# Patient Record
Sex: Female | Born: 1960 | Race: White | Hispanic: No | Marital: Single | Smoking: Never smoker
Health system: Southern US, Community
[De-identification: ages and names within clinical notes are randomized; demographics above are authoritative.]

## PROBLEM LIST (undated history)

## (undated) HISTORY — PX: WRIST SURGERY: SHX841

## (undated) HISTORY — PX: CHOLECYSTECTOMY: SHX55

## (undated) HISTORY — PX: APPENDECTOMY: SHX54

## (undated) HISTORY — PX: TONSILLECTOMY: SUR1361

---

## 2014-07-17 ENCOUNTER — Encounter (HOSPITAL_BASED_OUTPATIENT_CLINIC_OR_DEPARTMENT_OTHER): Payer: Self-pay | Admitting: *Deleted

## 2014-07-17 ENCOUNTER — Emergency Department (HOSPITAL_BASED_OUTPATIENT_CLINIC_OR_DEPARTMENT_OTHER)
Admission: EM | Admit: 2014-07-17 | Discharge: 2014-07-17 | Disposition: A | Discharge status: Home / Self Care | Payer: Medicaid - Out of State | Attending: Emergency Medicine | Admitting: Emergency Medicine

## 2014-07-17 DIAGNOSIS — S0501XA Injury of conjunctiva and corneal abrasion without foreign body, right eye, initial encounter: Secondary | ICD-10-CM

## 2014-07-17 DIAGNOSIS — Z91040 Latex allergy status: Secondary | ICD-10-CM | POA: Insufficient documentation

## 2014-07-17 DIAGNOSIS — S0591XA Unspecified injury of right eye and orbit, initial encounter: Secondary | ICD-10-CM | POA: Diagnosis present

## 2014-07-17 DIAGNOSIS — Y9389 Activity, other specified: Secondary | ICD-10-CM | POA: Insufficient documentation

## 2014-07-17 DIAGNOSIS — W504XXA Accidental scratch by another person, initial encounter: Secondary | ICD-10-CM | POA: Insufficient documentation

## 2014-07-17 DIAGNOSIS — Y9289 Other specified places as the place of occurrence of the external cause: Secondary | ICD-10-CM | POA: Diagnosis not present

## 2014-07-17 DIAGNOSIS — Y998 Other external cause status: Secondary | ICD-10-CM | POA: Insufficient documentation

## 2014-07-17 MED ORDER — TRAMADOL HCL 50 MG PO TABS
50.0000 mg | ORAL_TABLET | Freq: Four times a day (QID) | ORAL | Status: DC | PRN
Start: 2014-07-17 — End: 2021-10-29

## 2014-07-17 MED ORDER — ERYTHROMYCIN 5 MG/GM OP OINT: TOPICAL_OINTMENT | OPHTHALMIC | Status: AC

## 2014-07-17 MED ORDER — TETRACAINE HCL 0.5 % OP SOLN
2.0000 [drp] | Freq: Once | OPHTHALMIC | Status: AC
  Administered 2014-07-17: 2 [drp] via OPHTHALMIC
  Filled 2014-07-17: qty 2

## 2014-07-17 MED ORDER — FLUORESCEIN SODIUM 1 MG OP STRP
1.0000 | ORAL_STRIP | Freq: Once | OPHTHALMIC | Status: AC
  Administered 2014-07-17: 1 via OPHTHALMIC
  Filled 2014-07-17: qty 1

## 2014-07-17 NOTE — ED Provider Notes (Signed)
CSN: [id number]     Arrival date & time 07/17/14  1403 History   First MD Initiated Contact with Patient 07/17/14 1417     Chief Complaint  Patient presents with  . Eye Injury     (Consider location/radiation/quality/duration/timing/severity/associated sxs/prior Treatment) HPI Comments: Patient presents today with pain of the right eye.  She states that around 11 AM today an 54 month old child ran her finger across her right eye and scratched the eyeball.  She has had associated photophobia and tearing of the right eye since that time.  No vision changes.  No drainage aside from the tearing.  She has not taken anything for symptoms.  She does not wear contact lenses.    Patient is a 54 y.o. female presenting with eye injury. The history is provided by the patient.  Eye Injury    History reviewed. No pertinent past medical history. Past Surgical History  Procedure Laterality Date  . Wrist surgery    . Appendectomy    . Cholecystectomy    . Tonsillectomy     No family history on file. History  Substance Use Topics  . Smoking status: Never Smoker   . Smokeless tobacco: Not on file  . Alcohol Use: No   OB History    No data available     Review of Systems  All other systems reviewed and are negative.     Allergies  Codeine; Iodine; and Latex  Home Medications   Prior to Admission medications   Not on File   BP 130/71 mmHg  Pulse 74  Temp(Src) 98.1 F (36.7 C) (Oral)  Resp 18  Ht 5\' 1"  (1.549 m)  Wt 180 lb (81.647 kg)  BMI 34.03 kg/m2  SpO2 98% Physical Exam  Constitutional: She appears well-developed and well-nourished.  HENT:  Head: Normocephalic and atraumatic.  Mouth/Throat: Oropharynx is clear and moist.  Eyes: EOM and lids are normal. Pupils are equal, round, and reactive to light. Lids are everted and swept, no foreign bodies found. Right conjunctiva is injected. Right conjunctiva has no hemorrhage.  Slit lamp exam:      The right eye shows corneal  abrasion and fluorescein uptake.    Visual Acuity  Right Eye Distance: 20/20 (with glasses) Left Eye Distance: 20/25 (with glasses) Bilateral Distance: 20/20 (with Glasses)       Cardiovascular: Normal rate, regular rhythm and normal heart sounds.   Pulmonary/Chest: Effort normal and breath sounds normal.  Neurological: She is alert.  Skin: Skin is warm and dry.  Psychiatric: She has a normal mood and affect.  Nursing note and vitals reviewed.   ED Course  Procedures (including critical care time) Labs Review Labs Reviewed - No data to display  Imaging Review No results found.   EKG Interpretation None      MDM   Final diagnoses:  None   Patient presenting with right eye pain after an 54 month old child scratches the eye with her finger.  Normal visual acuity.  Exam showing a corneal abrasion of the right eye.  Patient given Rx for Erythromycin ointment and referral to Ophthalmology.  She is stable for discharge.  Return precautions given.      Santiago GladHeather Gerber Penza, PA-C 07/17/14 2324  Geoffery Lyonsouglas Delo, MD 07/18/14 [phone]

## 2014-07-17 NOTE — ED Notes (Signed)
Scratch to her right eye by a child accidentally poking her in the eye. Eye patch on arrival. She wears glasses.

## 2017-06-28 ENCOUNTER — Encounter (HOSPITAL_BASED_OUTPATIENT_CLINIC_OR_DEPARTMENT_OTHER): Payer: Self-pay

## 2017-06-28 ENCOUNTER — Other Ambulatory Visit: Payer: Self-pay | Admitting: Family Medicine

## 2017-06-28 DIAGNOSIS — G4733 Obstructive sleep apnea (adult) (pediatric): Secondary | ICD-10-CM

## 2017-06-28 DIAGNOSIS — N950 Postmenopausal bleeding: Secondary | ICD-10-CM

## 2017-07-01 ENCOUNTER — Ambulatory Visit
Admission: RE | Admit: 2017-07-01 | Discharge: 2017-07-01 | Disposition: A | Discharge status: Home / Self Care | Payer: Medicaid - Out of State | Source: Ambulatory Visit | Attending: Family Medicine | Admitting: Family Medicine

## 2017-07-01 DIAGNOSIS — N950 Postmenopausal bleeding: Secondary | ICD-10-CM

## 2017-07-21 ENCOUNTER — Ambulatory Visit (HOSPITAL_BASED_OUTPATIENT_CLINIC_OR_DEPARTMENT_OTHER): Payer: BLUE CROSS/BLUE SHIELD | Attending: Family Medicine | Admitting: Internal Medicine

## 2017-07-21 DIAGNOSIS — G4733 Obstructive sleep apnea (adult) (pediatric): Secondary | ICD-10-CM | POA: Diagnosis not present

## 2017-07-21 DIAGNOSIS — R0902 Hypoxemia: Secondary | ICD-10-CM | POA: Insufficient documentation

## 2017-07-24 NOTE — Procedures (Signed)
   Patient Name: Kari Pineda, Kari Pineda Study Date: 07/21/2017 Gender: Female D.O.B: Nov 13, 1960 Age (years): 56 Referring Provider: Nadyne CoombesKaren Richter Height (inches): 62 Interpreting Physician: Jetty Duhamellinton Young MD, ABSM Weight (lbs): 195 RPSGT: Blossom SinkBarksdale, Vernon BMI: 36 MRN: [id number] Neck Size: 14.50 <br> <br> CLINICAL INFORMATION Sleep Study Type: HST Indication for sleep study: OSA  Epworth Sleepiness Score: 5  SLEEP STUDY TECHNIQUE A multi-channel overnight portable sleep study was performed. The channels recorded were: nasal airflow, thoracic respiratory movement, and oxygen saturation with a pulse oximetry. Snoring was also monitored.  MEDICATIONS Patient self administered medications include: none reported.  SLEEP ARCHITECTURE Patient was studied for 390.5 minutes. The sleep efficiency was 100.0 % and the patient was supine for 86.1%. The arousal index was 0.0 per hour.  RESPIRATORY PARAMETERS The overall AHI was 31.7 per hour, with a central apnea index of 0.0 per hour. The oxygen nadir was 76% during sleep.  CARDIAC DATA Mean heart rate during sleep was 72.8 bpm.  IMPRESSIONS - Severe obstructive sleep apnea occurred during this study (AHI = 31.7/h). - No significant central sleep apnea occurred during this study (CAI = 0.0/h). - Severe oxygen desaturation was noted during this study (Min O2 = 76%, Mean 94%). Time <= 89% = 13 minutes. - Patient snored.  DIAGNOSIS - Obstructive Sleep Apnea (327.23 [G47.33 ICD-10]) - Nocturnal Hypoxemia (327.26 [G47.36 ICD-10])  RECOMMENDATIONS - Recommend CPAP titration or AutoPAP. Otheer options would be based on clinical judgment. - Be careful with alcohol, sedatives and other CNS depressants that may worsen sleep apnea and disrupt normal sleep architecture. - Sleep hygiene should be reviewed to assess factors that may improve sleep quality. - Weight management and regular exercise should be initiated or continued.  [Electronically  signed] 07/31/2017 09:56 AM  Jetty Duhamellinton Young MD, ABSM Diplomate, American Board of Sleep Medicine   NPI: [id number]                          Jetty Duhamellinton Young Diplomate, American Board of Sleep Medicine  ELECTRONICALLY SIGNED ON:  07/24/2017, 10:15 AM [facility] PH: [phone]   FX: [phone] ACCREDITED BY THE AMERICAN ACADEMY OF SLEEP MEDICINE

## 2017-07-31 DIAGNOSIS — G4733 Obstructive sleep apnea (adult) (pediatric): Secondary | ICD-10-CM | POA: Diagnosis not present

## 2019-01-19 IMAGING — US US PELVIS COMPLETE TRANSABD/TRANSVAG
1 series · 14 of 25 positions shown · non-contrast
Comparison: None

CLINICAL DATA: Postmenopausal bleeding

EXAM:
TRANSABDOMINAL AND TRANSVAGINAL ULTRASOUND OF PELVIS
TECHNIQUE: Both transabdominal and transvaginal ultrasound examinations of the
pelvis were performed. Transabdominal technique was performed for
global imaging of the pelvis including uterus, ovaries, adnexal
regions, and pelvic cul-de-sac. It was necessary to proceed with
endovaginal exam following the transabdominal exam to visualize the
ovaries and endometrium.

[Series 1: us pelvis complete transabd/transvag · 0.25mm/px · 14 of 42 slices shown]
[im 1/42]
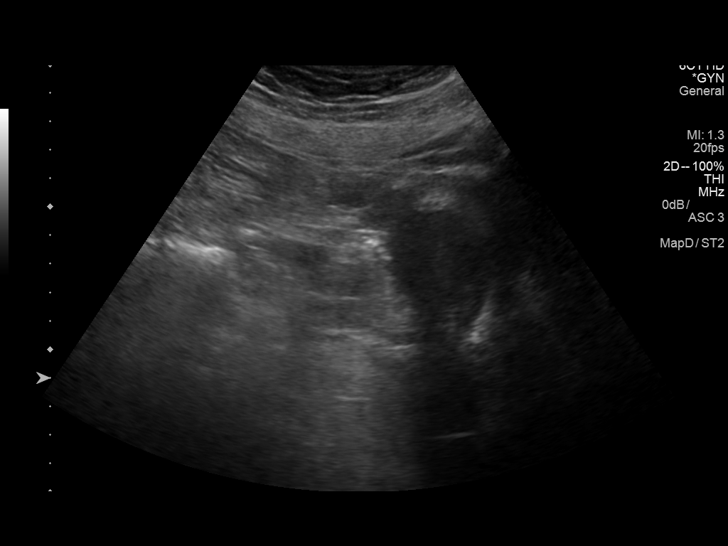
[im 4/42]
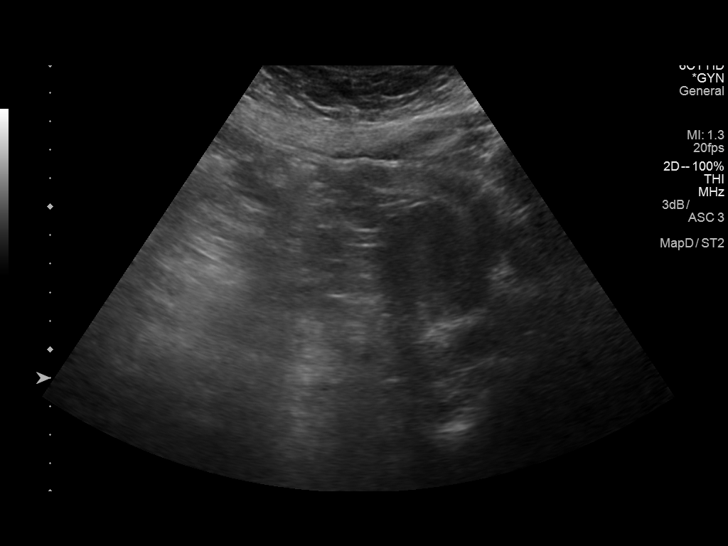
[im 7/42]
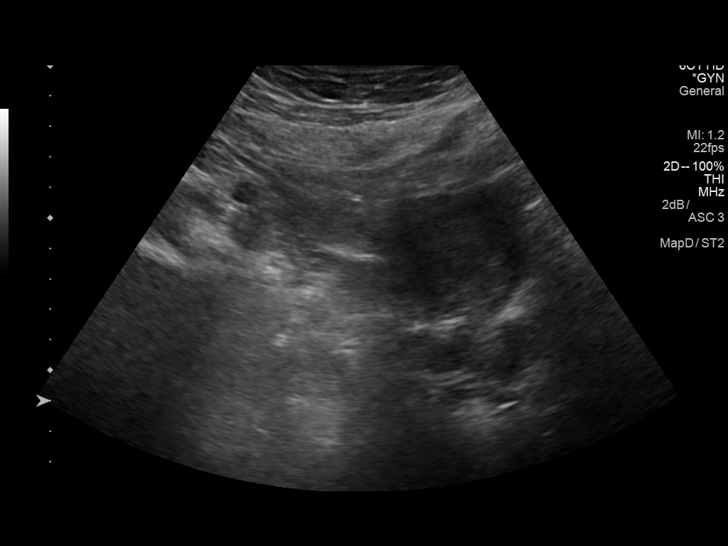
[im 11/42]
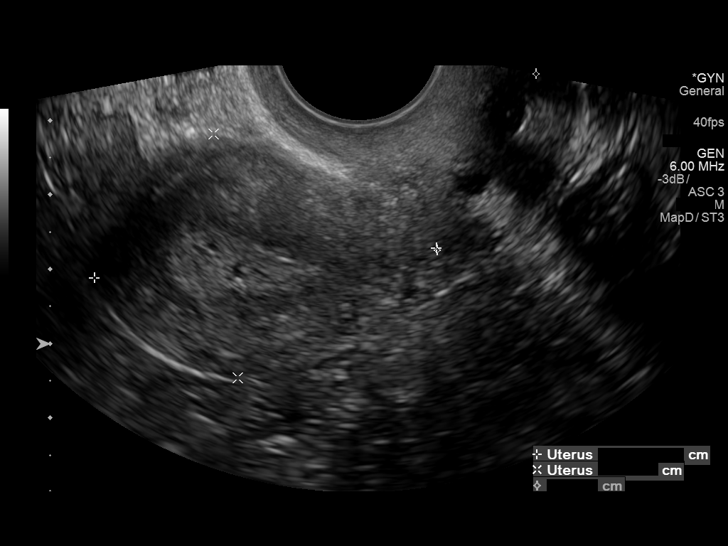
[im 14/42]
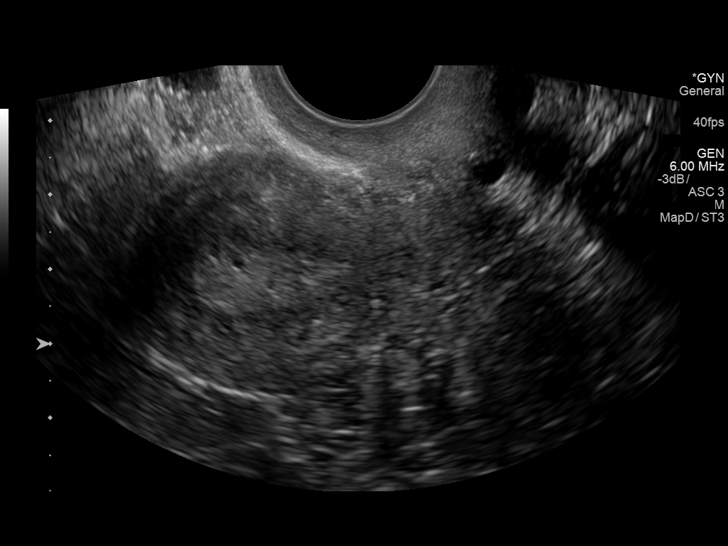
[im 16/42]
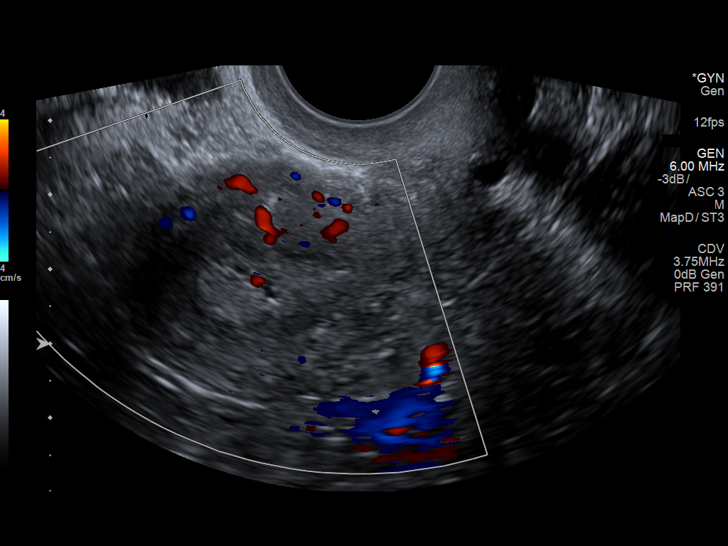
[im 19/42]
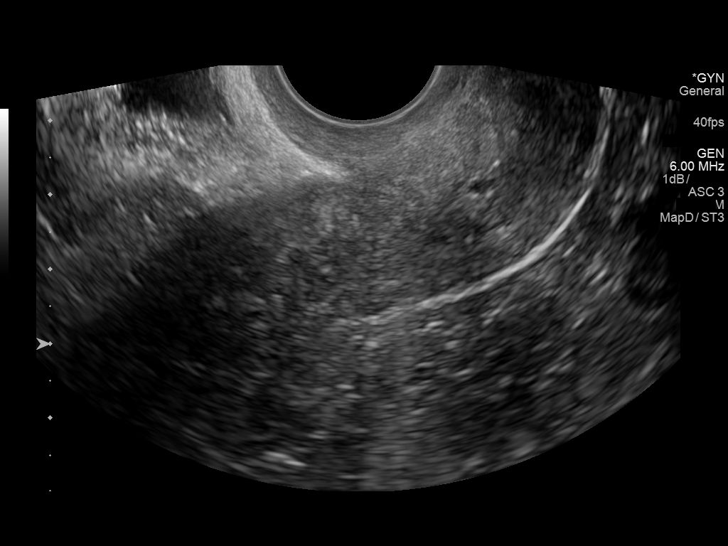
[im 23/42]
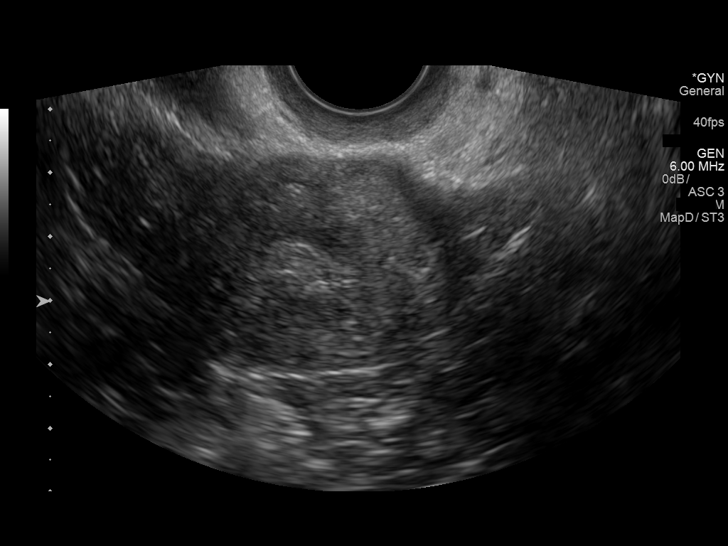
[im 26/42]
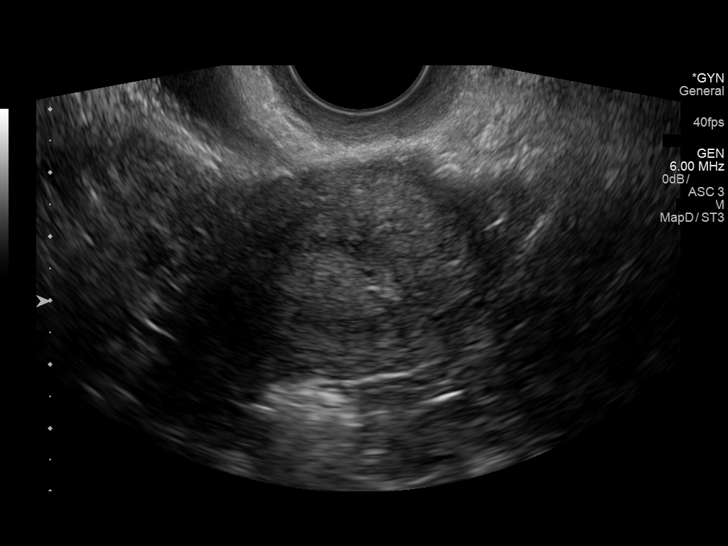
[im 28/42]
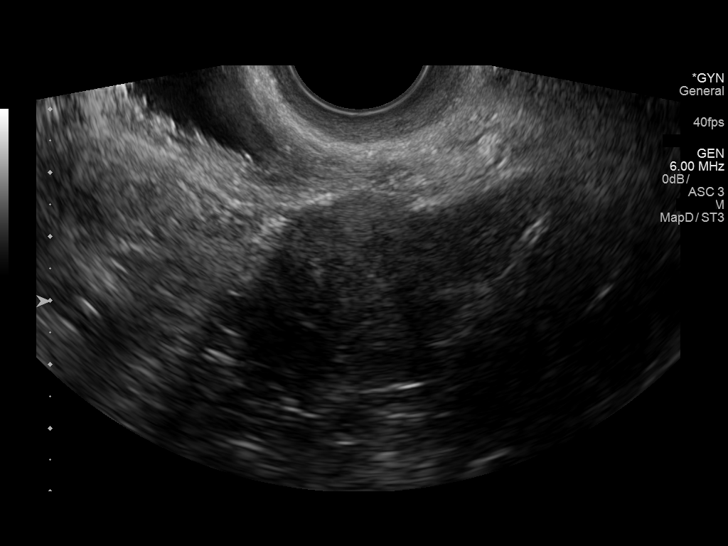
[im 31/42]
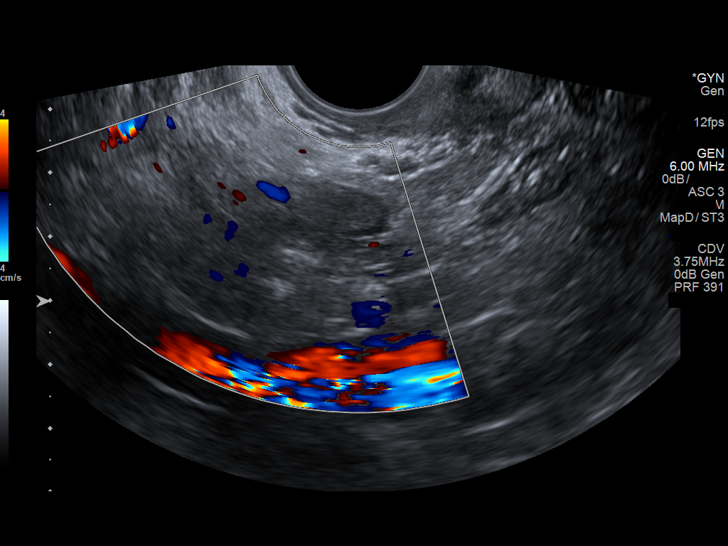
[im 35/42]
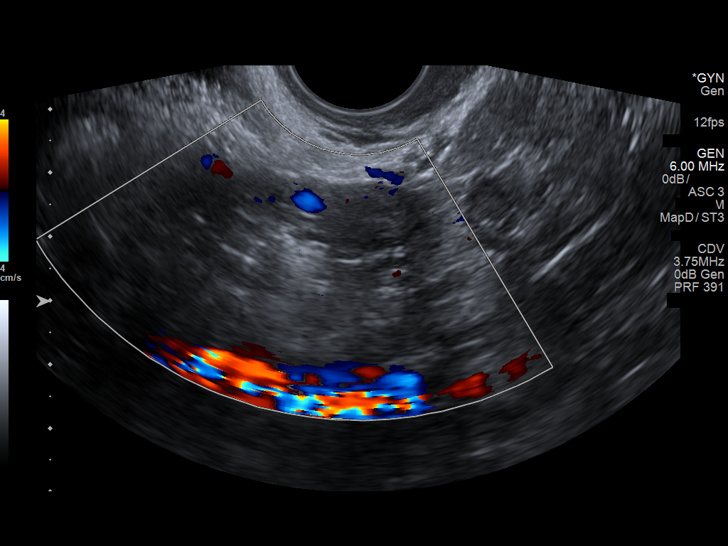
[im 38/42]
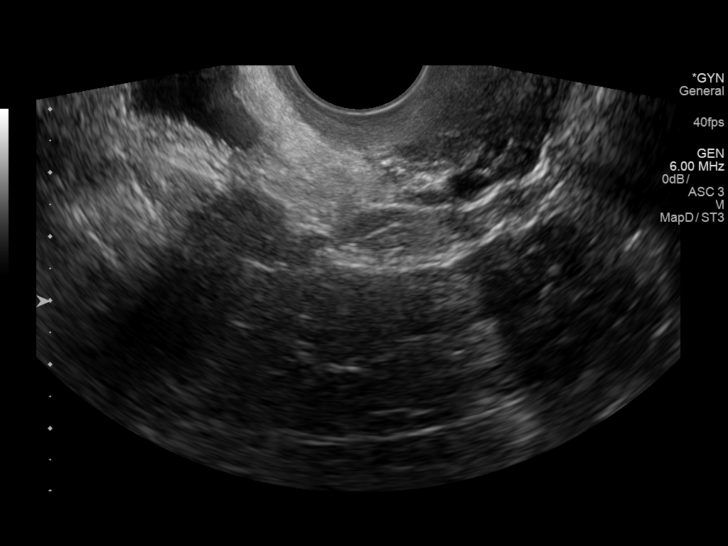
[im 42/42]
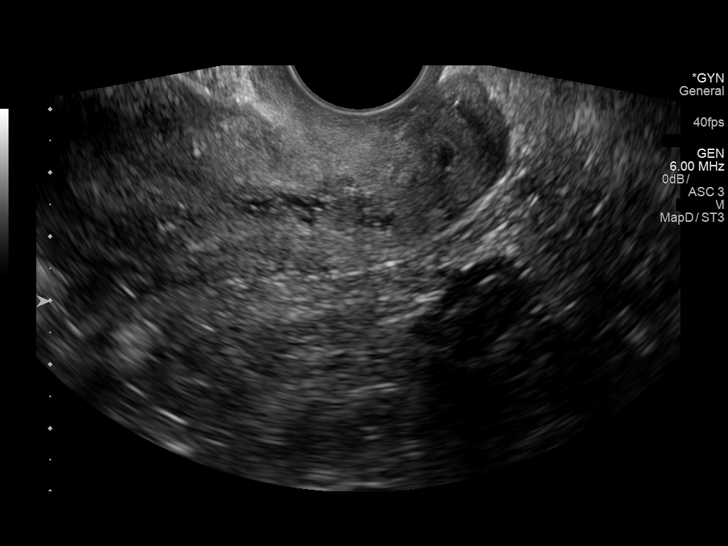

[14 of 25 positions shown; findings below may reference images not displayed]

FINDINGS: Uterus

Measurements: 7.8 x 3.7 x 4.8 cm. No myometrial abnormalities are
identified. Nabothian cysts are noted at the cervix.

Endometrium

Thickness: 10 mm.  A few small endometrial cysts are noted.

Right ovary

Measurements: 1.9 x 1.2 x 2.0 cm. Normal appearance/no adnexal mass.

Left ovary

Not visualized.  No adnexal mass.

Other findings

No abnormal free fluid.
IMPRESSION: 1. Thickened endometrium given postmenopausal status. Recommend
endometrial sampling.
2. No myometrial abnormalities.
3. Normal right ovary.  Left ovary not visualized.

## 2019-03-28 ENCOUNTER — Ambulatory Visit: Payer: BLUE CROSS/BLUE SHIELD | Admitting: Cardiovascular Disease

## 2019-04-03 ENCOUNTER — Ambulatory Visit: Payer: Self-pay | Admitting: Cardiovascular Disease

## 2019-04-03 ENCOUNTER — Other Ambulatory Visit: Payer: Self-pay

## 2021-10-29 ENCOUNTER — Ambulatory Visit
Admission: EM | Admit: 2021-10-29 | Discharge: 2021-10-29 | Disposition: A | Discharge status: Home / Self Care | Payer: 59 | Attending: Urgent Care | Admitting: Urgent Care

## 2021-10-29 ENCOUNTER — Encounter: Payer: Self-pay | Admitting: Emergency Medicine

## 2021-10-29 ENCOUNTER — Other Ambulatory Visit: Payer: Self-pay

## 2021-10-29 DIAGNOSIS — M7660 Achilles tendinitis, unspecified leg: Secondary | ICD-10-CM | POA: Diagnosis not present

## 2021-10-29 DIAGNOSIS — M25572 Pain in left ankle and joints of left foot: Secondary | ICD-10-CM

## 2021-10-29 DIAGNOSIS — W19XXXA Unspecified fall, initial encounter: Secondary | ICD-10-CM

## 2021-10-29 DIAGNOSIS — Z87828 Personal history of other (healed) physical injury and trauma: Secondary | ICD-10-CM

## 2021-10-29 DIAGNOSIS — M79662 Pain in left lower leg: Secondary | ICD-10-CM | POA: Diagnosis not present

## 2021-10-29 MED ORDER — IBUPROFEN 800 MG PO TABS: 800.0000 mg | ORAL_TABLET | Freq: Three times a day (TID) | ORAL | 0 refills | Status: AC

## 2021-10-29 MED ORDER — TRAMADOL HCL 50 MG PO TABS: 50.0000 mg | ORAL_TABLET | Freq: Four times a day (QID) | ORAL | 0 refills | Status: AC | PRN

## 2021-10-29 NOTE — ED Triage Notes (Signed)
Patient presents to [facility] for evaluation of left calf and foot and ankle pain after stepping off a ladder yesterday and feeling a pop.  Patient states she has a hx of achilles tendon tear to this ankle, feels similar ?

## 2021-10-29 NOTE — Discharge Instructions (Addendum)
He had declined an x-ray with [place] today.  However it will be very important to make sure you see an orthopedist from the list I provide you.  This is important to make sure that you do not end up having a fracture or recurrent Achilles tendon rupture.  They can also evaluate for a tear of your calf muscle.  Try Dr. Jena Gauss first as they are on call.  Make sure you stay off of your foot.  Use your crutches if you absolutely have to move around.  Start ibuprofen for pain and inflammation.  Use tramadol for severe pain not controlled by ibuprofen.  However, if you do not need tramadol then do not use it. ?

## 2021-10-29 NOTE — ED Provider Notes (Signed)
?[place] ? ? ?MRN: [id number] DOB: 26-Jan-1961 ? ?Subjective:  ? ?Kari Pineda is a 61 y.o. female presenting for 1 day history of suffering a left lower leg, ankle injury.  Patient was coming off of a ladder and lost her footing causing her foot to hyper flex/hyper dorsiflex.  She has a history of a torn Achilles tendon and is worried that this is happened again.  She does recall that she heard 2 distinct pops.  One of them occurred when she was coming off the ladder and another when she was being stabilized and being helped to her feet.  She has since had significant difficulty bearing any kind of weight on her left foot.  Has had severe pain.  Has been icing. ? ?No current facility-administered medications for this encounter. ? ?Current Outpatient Medications:  ?  erythromycin ophthalmic ointment, Place a 1/2 inch ribbon of ointment into the lower eyelid.  Use four times daily for 5 days, Disp: 1 g, Rfl: 0 ?  traMADol (ULTRAM) 50 MG tablet, Take 1 tablet (50 mg total) by mouth every 6 (six) hours as needed., Disp: 15 tablet, Rfl: 0  ? ?Allergies  ?Allergen Reactions  ? Codeine Anaphylaxis  ? Iodine Anaphylaxis  ? Penicillins Anaphylaxis  ? Latex   ?  rash  ? ? ?History reviewed. No pertinent past medical history.  ? ?Past Surgical History:  ?Procedure Laterality Date  ? APPENDECTOMY    ? CHOLECYSTECTOMY    ? TONSILLECTOMY    ? WRIST SURGERY    ? ? ?Family History  ?Problem Relation Age of Onset  ? Diabetes Mother   ? Heart attack Father   ? ? ?Social History  ? ?Tobacco Use  ? Smoking status: Never  ?Substance Use Topics  ? Alcohol use: No  ? Drug use: No  ? ? ?ROS ? ? ?Objective:  ? ?Vitals: ?BP 126/87 (BP Location: Right Arm)   Pulse 72   Temp 98.3 ?F (36.8 ?C) (Oral)   Resp 18   SpO2 96%  ? ?Physical Exam ?Constitutional:   ?   General: She is not in acute distress. ?   Appearance: Normal appearance. She is well-developed. She is not ill-appearing, toxic-appearing or  diaphoretic.  ?HENT:  ?   Head: Normocephalic and atraumatic.  ?   Nose: Nose normal.  ?   Mouth/Throat:  ?   Mouth: Mucous membranes are moist.  ?Eyes:  ?   General: No scleral icterus.    ?   Right eye: No discharge.     ?   Left eye: No discharge.  ?   Extraocular Movements: Extraocular movements intact.  ?Cardiovascular:  ?   Rate and Rhythm: Normal rate.  ?Pulmonary:  ?   Effort: Pulmonary effort is normal.  ?Musculoskeletal:  ?   Left lower leg: Tenderness (over mid to superior gastrocnemius) present. No swelling, deformity, lacerations or bony tenderness. No edema.  ?   Left ankle: Swelling present. No deformity, ecchymosis or lacerations. Tenderness present over the lateral malleolus and AITF ligament. No medial malleolus, ATF ligament, CF ligament, posterior TF ligament, base of 5th metatarsal or proximal fibula tenderness. Decreased range of motion.  ?   Left Achilles Tendon: Tenderness present.  ?   Comments: Patient guarding extensively and therefore cannot able to evaluate for Thompson's test.  ?Skin: ?   General: Skin is warm and dry.  ?Neurological:  ?   General: No focal deficit present.  ?  Mental Status: She is alert and oriented to person, place, and time.  ?Psychiatric:     ?   Mood and Affect: Mood normal.     ?   Behavior: Behavior normal.  ? ? ?Patient was placed into a short leg splint with the left leg in slight plantarflexion.  Provided with crutches to ambulate. ? ?Assessment and Plan :  ? ?I have reviewed the PDMP during this encounter. ? ?1. Acute left ankle pain   ?2. Pain of left calf   ?3. Pain in Achilles tendon   ?4. History of rupture of Achilles tendon   ?5. Accidental fall, initial encounter   ? ?Patient declined an offer for an outpatient x-ray x3.  She is primarily concerned with being evaluated for an Achilles tendon rupture.  I advised that this can be done best through an MRI or ultrasound but was willing to do the x-ray as there are findings that can suggest an Achilles  tendon injury.  Still patient refused.  We will try conservative management with immobilization, crutches for ambulation.  Emphasized need to follow-up urgently with an orthopedist and provided her with information for this.  For pain, use ibuprofen regularly 800 mg 3 times a day with food.  Use tramadol for breakthrough pain.  She does have a history of anaphylaxis with codeine but has done well with tramadol in the past. Counseled patient on potential for adverse effects with medications prescribed/recommended today, ER and return-to-clinic precautions discussed, patient verbalized understanding. ? ?  ?Jaynee Eagles, PA-C ?10/29/21 1128 ? ?

## 2022-04-04 ENCOUNTER — Other Ambulatory Visit: Payer: Self-pay

## 2022-04-04 ENCOUNTER — Encounter (HOSPITAL_BASED_OUTPATIENT_CLINIC_OR_DEPARTMENT_OTHER): Payer: Self-pay | Admitting: Emergency Medicine

## 2022-04-04 ENCOUNTER — Emergency Department (HOSPITAL_BASED_OUTPATIENT_CLINIC_OR_DEPARTMENT_OTHER)
Admission: EM | Admit: 2022-04-04 | Discharge: 2022-04-04 | Disposition: A | Discharge status: Home / Self Care | Payer: Commercial Managed Care - HMO | Attending: Emergency Medicine | Admitting: Emergency Medicine

## 2022-04-04 DIAGNOSIS — Z91040 Latex allergy status: Secondary | ICD-10-CM | POA: Diagnosis not present

## 2022-04-04 DIAGNOSIS — K0889 Other specified disorders of teeth and supporting structures: Secondary | ICD-10-CM | POA: Diagnosis present

## 2022-04-04 DIAGNOSIS — K047 Periapical abscess without sinus: Secondary | ICD-10-CM | POA: Diagnosis not present

## 2022-04-04 MED ORDER — CHLORHEXIDINE GLUCONATE 0.12 % MT SOLN: 15.0000 mL | Freq: Two times a day (BID) | OROMUCOSAL | 0 refills | Status: AC

## 2022-04-04 MED ORDER — CLINDAMYCIN HCL 150 MG PO CAPS
300.0000 mg | ORAL_CAPSULE | Freq: Once | ORAL | Status: AC
  Administered 2022-04-04: 300 mg via ORAL
  Filled 2022-04-04: qty 2

## 2022-04-04 MED ORDER — CLINDAMYCIN HCL 150 MG PO CAPS: 450.0000 mg | ORAL_CAPSULE | Freq: Four times a day (QID) | ORAL | 0 refills | Status: AC

## 2022-04-04 MED ORDER — BUPIVACAINE HCL (PF) 0.5 % IJ SOLN: 10.0000 mL | Freq: Once | INTRAMUSCULAR | Status: DC

## 2022-04-04 MED ORDER — BUPIVACAINE-EPINEPHRINE (PF) 0.5% -1:200000 IJ SOLN
1.8000 mL | Freq: Once | INTRAMUSCULAR | Status: AC
  Administered 2022-04-04: 1.8 mL
  Filled 2022-04-04: qty 1.8

## 2022-04-04 MED ORDER — BENZOCAINE 20 % MT AERO
INHALATION_SPRAY | Freq: Once | OROMUCOSAL | Status: AC
  Administered 2022-04-04: 1 via OROMUCOSAL
  Filled 2022-04-04: qty 57

## 2022-04-04 NOTE — ED Notes (Signed)
Pt ambulatory to exam room at this time escorted by charge nurse - no acute distress noted.

## 2022-04-04 NOTE — ED Notes (Signed)
ED Provider at bedside to drain dental abscess

## 2022-04-04 NOTE — ED Triage Notes (Addendum)
Pt states she woke up tonight with facial pain and swelling on the left side  Pt states she thinks it may be from a tooth  Pt is c/o painPt states she took tylenol and ibuprofen at home around 0130 without relief

## 2022-04-04 NOTE — ED Notes (Signed)
Pt agreeable with d/c plan as discussed by provider- this nurse has verbally reinforced d/c instructions and provided pt with written copy; pt acknowledges verbal understanding and denies any addl questions concerns needs- ambulatory independently at d/c with steady gait; no acute changes or distress noted.

## 2022-04-04 NOTE — ED Provider Notes (Signed)
[facility] EMERGENCY DEPARTMENT Provider Note   CSN: [id number] Arrival date & time: 04/04/22  8115     History  Chief Complaint  Patient presents with   Dental Pain    Kari Pineda is a 62 y.o. female.  The history is provided by the patient and medical records.  Dental Pain Kari Pineda is a 61 y.o. female who presents to the Emergency Department complaining of dental pain.  She was in her routine state of when she went to bed and woke up around 1 AM with left lower jaw pain.  No fever but does feel swollen in that area.  She took 2 Tylenol and 4 ibuprofen at home with persistent pain.  No associated throat swelling or difficulty breathing.  No known medical problems and takes no medications.  She does have a dentist.      Home Medications Prior to Admission medications   Medication Sig Start Date End Date Taking? Authorizing Provider  chlorhexidine (PERIDEX) 0.12 % solution Use as directed 15 mLs in the mouth or throat 2 (two) times daily. 04/04/22  Yes Tilden Fossa, MD  clindamycin (CLEOCIN) 150 MG capsule Take 3 capsules (450 mg total) by mouth every 6 (six) hours. 04/04/22  Yes Tilden Fossa, MD  erythromycin ophthalmic ointment Place a 1/2 inch ribbon of ointment into the lower eyelid.  Use four times daily for 5 days 07/17/14   Santiago Glad, PA-C  ibuprofen (ADVIL) 800 MG tablet Take 1 tablet (800 mg total) by mouth 3 (three) times daily. 10/29/21   Wallis Bamberg, PA-C  traMADol (ULTRAM) 50 MG tablet Take 1 tablet (50 mg total) by mouth every 6 (six) hours as needed. 10/29/21   Wallis Bamberg, PA-C      Allergies    Codeine, Iodine, Penicillins, and Latex    Review of Systems   Review of Systems  All other systems reviewed and are negative.   Physical Exam Updated Vital Signs BP (!) 147/88   Pulse 65   Temp 98.5 F (36.9 C) (Oral)   Resp 16   Ht 5\' 2"  (1.575 m)   Wt 86.2 kg   SpO2 97%   BMI 34.75 kg/m  Physical Exam Vitals and nursing  note reviewed.  Constitutional:      Appearance: She is well-developed.  HENT:     Head: Normocephalic and atraumatic.     Comments: There is a cap on tooth 20 with significant tenderness to palpation in this area.  There is mild to moderate edema of the buccal mucosa on the left with significant tenderness to palpation at the base of tooth 20 with focal fullness there.  There is no significant sublingual swelling.  There is no swelling in the posterior oropharynx Cardiovascular:     Rate and Rhythm: Normal rate and regular rhythm.  Pulmonary:     Effort: Pulmonary effort is normal. No respiratory distress.  Musculoskeletal:        General: No tenderness.  Skin:    General: Skin is warm and dry.  Neurological:     Mental Status: She is alert and oriented to person, place, and time.  Psychiatric:        Behavior: Behavior normal.     ED Results / Procedures / Treatments   Labs (all labs ordered are listed, but only abnormal results are displayed) Labs Reviewed - No data to display  EKG None  Radiology No results found.  Procedures Dental Block  Date/Time: 04/04/2022 6:43 AM  Performed by: Quintella Reichert, MD Authorized by: Quintella Reichert, MD   Consent:    Consent obtained:  Verbal   Consent given by:  Patient   Risks discussed:  Infection, hematoma, pain, unsuccessful block and swelling Universal protocol:    Patient identity confirmed:  Verbally with patient Indications:    Indications: dental abscess   Location:    Block type:  Supraperiosteal   Supraperiosteal location:  Lower teeth   Lower teeth location:  20/LL 2nd bicuspid Procedure details:    Topical anesthetic:  Benzocaine spray   Syringe type:  Aspirating dental syringe   Needle gauge:  27 G   Anesthetic injected:  Bupivacaine 0.5% WITH epi   Injection procedure:  Anatomic landmarks identified, introduced needle and incremental injection Post-procedure details:    Outcome:  Pain relieved    Procedure completion:  Tolerated .Marland KitchenIncision and Drainage  Date/Time: 04/04/2022 6:44 AM  Performed by: Quintella Reichert, MD Authorized by: Quintella Reichert, MD   Consent:    Consent obtained:  Verbal   Consent given by:  Patient   Risks discussed:  Bleeding, incomplete drainage, infection and pain   Alternatives discussed:  Alternative treatment Universal protocol:    Patient identity confirmed:  Verbally with patient Location:    Type:  Abscess   Location:  Mouth   Mouth location: subperiostial, tooth 20. Sedation:    Sedation type:  None Anesthesia:    Anesthesia method: dental block - separate note. Procedure type:    Complexity:  Simple Procedure details:    Incision types:  Stab incision   Drainage:  Bloody   Drainage amount:  Scant   Wound treatment:  Wound left open Post-procedure details:    Procedure completion:  Tolerated well, no immediate complications     Medications Ordered in ED Medications  clindamycin (CLEOCIN) capsule 300 mg (300 mg Oral Given 04/04/22 0548)  Benzocaine (HURRCAINE) 20 % mouth spray (1 Application Mouth/Throat Given 04/04/22 0549)  bupivacaine-epinephrine (PF) (MARCAINE W/ EPI) 0.5% -1:200000 injection 1.8 mL (1.8 mLs Infiltration Given 04/04/22 0550)    ED Course/ Medical Decision Making/ A&P                           Medical Decision Making Risk Prescription drug management.   Patient here for evaluation of dental pain and facial swelling.  She is nontoxic-appearing on evaluation.  She does have an examination is consistent with dental abscess with culprit tooth #20.  She received a dental block with good relief of pain.  I&D was performed with minimal purulent material removed predominantly bloody material.  Discussed with patient home care for dental abscess.  Discussed close dentistry follow-up as well as return precautions.  Also discussed OTC analgesics for pain.  Presentation is not consistent with sepsis, Ludwigs angina,  sialoadenitis       Final Clinical Impression(s) / ED Diagnoses Final diagnoses:  Dental abscess    Rx / DC Orders ED Discharge Orders          Ordered    clindamycin (CLEOCIN) 150 MG capsule  Every 6 hours        04/04/22 0607    chlorhexidine (PERIDEX) 0.12 % solution  2 times daily        04/04/22 1607              Quintella Reichert, MD 04/04/22 [phone]

## 2024-02-11 ENCOUNTER — Other Ambulatory Visit: Payer: Self-pay

## 2024-02-11 ENCOUNTER — Emergency Department (HOSPITAL_COMMUNITY)
Admission: EM | Admit: 2024-02-11 | Discharge: 2024-02-12 | Disposition: A | Discharge status: Home / Self Care | Attending: Emergency Medicine | Admitting: Emergency Medicine

## 2024-02-11 ENCOUNTER — Emergency Department (HOSPITAL_COMMUNITY)

## 2024-02-11 DIAGNOSIS — Y92410 Unspecified street and highway as the place of occurrence of the external cause: Secondary | ICD-10-CM | POA: Diagnosis not present

## 2024-02-11 DIAGNOSIS — M542 Cervicalgia: Secondary | ICD-10-CM | POA: Diagnosis not present

## 2024-02-11 DIAGNOSIS — M25572 Pain in left ankle and joints of left foot: Secondary | ICD-10-CM | POA: Insufficient documentation

## 2024-02-11 DIAGNOSIS — M545 Low back pain, unspecified: Secondary | ICD-10-CM | POA: Insufficient documentation

## 2024-02-11 DIAGNOSIS — Z91040 Latex allergy status: Secondary | ICD-10-CM | POA: Insufficient documentation
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
## 2024-02-12 ENCOUNTER — Encounter (HOSPITAL_COMMUNITY): Payer: Self-pay

## 2024-02-12 MED ORDER — METHOCARBAMOL 500 MG PO TABS: 500.0000 mg | ORAL_TABLET | Freq: Two times a day (BID) | ORAL | 0 refills | Status: AC

## 2024-02-12 MED ORDER — NAPROXEN 250 MG PO TABS
500.0000 mg | ORAL_TABLET | Freq: Once | ORAL | Status: AC
  Administered 2024-02-12: 500 mg via ORAL
  Filled 2024-02-12: qty 2

## 2024-02-12 MED ORDER — NAPROXEN 500 MG PO TABS: 500.0000 mg | ORAL_TABLET | Freq: Two times a day (BID) | ORAL | 0 refills | Status: AC

## 2024-02-12 MED ORDER — METHOCARBAMOL 500 MG PO TABS
500.0000 mg | ORAL_TABLET | Freq: Once | ORAL | Status: AC
  Administered 2024-02-12: 500 mg via ORAL
  Filled 2024-02-12: qty 1

## 2024-02-12 NOTE — ED Notes (Signed)
 PT D/C'D AFTER INSTRUCTIONS REVIEWED. PT VERBALIZED UNDERSTANDING. NAD REPORTED OR NOTED AT THIS TIME.
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
## 2024-02-12 NOTE — Discharge Instructions (Signed)
 Take the prescribed medication as directed.  Can try to ice and elevate ankle as well to keep any pain/swelling down.  May want to use heating pad on neck/back for spasm. Follow-up with Dr. Sharl if ankle continues to have issues over the next week or so.  Call his office for appt. Return to the ED for new or worsening symptoms.

## 2024-03-06 ENCOUNTER — Encounter: Payer: Self-pay | Admitting: Physician Assistant

## 2024-03-06 ENCOUNTER — Other Ambulatory Visit: Payer: Self-pay

## 2024-03-06 ENCOUNTER — Ambulatory Visit (INDEPENDENT_AMBULATORY_CARE_PROVIDER_SITE_OTHER): Admitting: Physician Assistant

## 2024-03-06 DIAGNOSIS — M25562 Pain in left knee: Secondary | ICD-10-CM

## 2024-03-06 DIAGNOSIS — M25572 Pain in left ankle and joints of left foot: Secondary | ICD-10-CM | POA: Diagnosis not present
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
## 2024-03-27 ENCOUNTER — Ambulatory Visit (INDEPENDENT_AMBULATORY_CARE_PROVIDER_SITE_OTHER): Admitting: Physician Assistant

## 2024-03-27 ENCOUNTER — Encounter: Payer: Self-pay | Admitting: Physician Assistant

## 2024-03-27 DIAGNOSIS — M25872 Other specified joint disorders, left ankle and foot: Secondary | ICD-10-CM

## 2024-03-27 DIAGNOSIS — M25879 Other specified joint disorders, unspecified ankle and foot: Secondary | ICD-10-CM

## 2024-03-27 MED ORDER — METHYLPREDNISOLONE ACETATE 40 MG/ML IJ SUSP
40.0000 mg | INTRAMUSCULAR | Status: AC | PRN
  Administered 2024-03-27: 40 mg via INTRA_ARTICULAR

## 2024-03-27 MED ORDER — LIDOCAINE HCL 1 % IJ SOLN
2.0000 mL | INTRAMUSCULAR | Status: AC | PRN
  Administered 2024-03-27: 2 mL
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
## 2024-04-10 ENCOUNTER — Ambulatory Visit: Admitting: Physician Assistant

## 2024-04-13 ENCOUNTER — Encounter: Payer: Self-pay | Admitting: Physician Assistant

## 2024-04-13 ENCOUNTER — Ambulatory Visit (INDEPENDENT_AMBULATORY_CARE_PROVIDER_SITE_OTHER): Admitting: Physician Assistant

## 2024-04-13 DIAGNOSIS — M25879 Other specified joint disorders, unspecified ankle and foot: Secondary | ICD-10-CM | POA: Diagnosis not present

## 2024-04-13 DIAGNOSIS — M779 Enthesopathy, unspecified: Secondary | ICD-10-CM | POA: Diagnosis not present
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
## 2024-04-17 ENCOUNTER — Encounter: Payer: Self-pay | Admitting: Radiology

## 2024-04-18 ENCOUNTER — Telehealth: Payer: Self-pay | Admitting: Physician Assistant

## 2024-04-18 NOTE — Telephone Encounter (Signed)
 Kari Pineda called from [facility] stating that pt is scheduled for MRI with them, but they are out of network with her insurance. Was wanting to know if we want to schedule with someone in her net work. Call back number is [phone] ext [phone]

## 2024-04-19 NOTE — Telephone Encounter (Signed)
 I called and left vm for pt advising her I will send her to [facility] Radiology and they will be the ones to contact her to schedule appt. Also, gave her the number to call if she wants.

## 2024-04-30 ENCOUNTER — Other Ambulatory Visit

## 2024-05-01 ENCOUNTER — Encounter: Payer: Self-pay | Admitting: Rehabilitative and Restorative Service Providers"

## 2024-05-01 ENCOUNTER — Ambulatory Visit (INDEPENDENT_AMBULATORY_CARE_PROVIDER_SITE_OTHER): Admitting: Rehabilitative and Restorative Service Providers"

## 2024-05-01 DIAGNOSIS — M6281 Muscle weakness (generalized): Secondary | ICD-10-CM | POA: Diagnosis not present

## 2024-05-01 DIAGNOSIS — R262 Difficulty in walking, not elsewhere classified: Secondary | ICD-10-CM

## 2024-05-01 DIAGNOSIS — M79605 Pain in left leg: Secondary | ICD-10-CM

## 2024-05-01 DIAGNOSIS — M25572 Pain in left ankle and joints of left foot: Secondary | ICD-10-CM | POA: Diagnosis not present
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
## 2024-05-17 ENCOUNTER — Encounter: Admitting: Rehabilitative and Restorative Service Providers"

## 2024-05-19 ENCOUNTER — Encounter: Admitting: Rehabilitative and Restorative Service Providers"

## 2024-05-23 ENCOUNTER — Ambulatory Visit: Admitting: Rehabilitative and Restorative Service Providers"

## 2024-05-23 ENCOUNTER — Encounter: Payer: Self-pay | Admitting: Rehabilitative and Restorative Service Providers"

## 2024-05-23 DIAGNOSIS — M25572 Pain in left ankle and joints of left foot: Secondary | ICD-10-CM

## 2024-05-23 DIAGNOSIS — R262 Difficulty in walking, not elsewhere classified: Secondary | ICD-10-CM

## 2024-05-23 DIAGNOSIS — M6281 Muscle weakness (generalized): Secondary | ICD-10-CM

## 2024-05-23 DIAGNOSIS — M79605 Pain in left leg: Secondary | ICD-10-CM

## 2024-05-23 NOTE — Therapy (Signed)
 OUTPATIENT PHYSICAL THERAPY TREATMENT   Patient Name: Kari Pineda MRN: [id number] DOB:1961-05-12, 63 y.o., female Today's Date: 05/23/2024  END OF SESSION:  PT End of Session - 05/23/24 0954     Visit Number 2    Number of Visits 20    Date for Recertification  07/10/24    Authorization Type [employer] auth needed,   no copay    Authorization - Visit Number 2    Authorization - Number of Visits 20    Progress Note Due on Visit 10    PT Start Time 1004    PT Stop Time 1044    PT Time Calculation (min) 40 min    Activity Tolerance Patient tolerated treatment well    Behavior During Therapy [facility] for tasks assessed/performed           History reviewed. No pertinent past medical history. Past Surgical History:  Procedure Laterality Date   APPENDECTOMY     CHOLECYSTECTOMY     TONSILLECTOMY     WRIST SURGERY     There are no active problems to display for this patient.   PCP: Burney Pao MD  REFERRING PROVIDER: Gerome Maurilio HERO, PA-C  REFERRING DIAG: [phone] (ICD-10-CM) - Impingement of anterior part of ankle M77.9 (ICD-10-CM) - Tendonitis  THERAPY DIAG:  Pain in left ankle and joints of left foot  Pain in left leg  Muscle weakness (generalized)  Difficulty in walking, not elsewhere classified  Rationale for Evaluation and Treatment: Rehabilitation  ONSET DATE:  02/11/2024  SUBJECTIVE:   SUBJECTIVE STATEMENT: Pt indicated having tightness one day with exercise and then felt a pop that created looser feeling and less pain complaints.   PERTINENT HISTORY:   PAIN:  NPRS scale: upon arrival today 0/10.  Pain location: Lt ankle anteriorly.  Can shoot up leg.  Pain description: sharp, shooting.  Aggravating factors: prolonged WB activity, walking, standing.  Relieving factors: rest, inversion.   PRECAUTIONS: None  WEIGHT BEARING RESTRICTIONS: No  FALLS:  Has patient fallen in last 6 months? No  LIVING ENVIRONMENT: Lives in:  House/apartment Stairs: flight of stairs to bedroom.   No stairs in garage.  Front porch with 3 stairs with bilateral rails.   OCCUPATION: [employer], [employer] (unable to work since injury.   PLOF: Independent, hiking, play softball/basketball.   PATIENT GOALS: Reduce pain, get back to activities.  Walk normal.  OBJECTIVE:   DIAGNOSTIC FINDINGS:  X rays 03/06/2024 B knee with medial joint line narrowing Ankle no fracture noted, mortis intact  PATIENT SURVEYS:  Patient-Specific Activity Scoring Scheme  0 represents "unable to perform." 10 represents "able to perform at prior level. 0 1 2 3 4 5 6 7 8 9  10 (Date and Score)   Activity Eval  05/01/2024    1. Hike 3     2. Walk outside  3    3. Driving 4   4. Clean house 4   5. work 2   Score 3.2    Total score = sum of the activity scores/number of activities Minimum detectable change (90%CI) for average score = 2 points Minimum detectable change (90%CI) for single activity score = 3 points  COGNITION: 05/01/2024 Overall cognitive status: WFL    SENSATION: 05/01/2024 Not specific tested for dermatomes.   EDEMA:  05/01/2024 Figure 8 Lt ankle: 49 cm Figure 8 Rt ankle: 49 cms  MUSCLE LENGTH: 05/01/2024 No specific testing today.   POSTURE:  05/01/2024 Deviation off Lt leg in standing posture.   PALPATION:  05/01/2024 Tenderness anterior joint line Lt talocrural joint, lateral joint line inferior to lateral malleolus.   LOWER EXTREMITY MMT:    Right Eval 05/01/2024 Left Eval 05/01/2024  Hip flexion    Hip extension    Hip abduction    Hip adduction    Hip internal rotation    Hip external rotation    Knee flexion 5/5 4/5  Knee extension 5/5 4/5  Ankle dorsiflexion 5/5 3+/5  Ankle plantarflexion  2+/5  Ankle inversion 5/5 3+/5  Ankle eversion 5/5 3+/5   (Blank rows = not tested)  LOWER EXTREMITY ROM:   Right Eval 05/01/2024 Left Eval 05/01/2024 Left 05/23/2024 AROM  Hip flexion     Hip  extension     Hip abduction     Hip adduction     Hip internal rotation     Hip external rotation     Knee flexion     Knee extension     Ankle dorsiflexion 13 AROM in 90 deg knee flexion -9 AROM in 90 deg in knee flexion With pain  -11 AROM in 0 deg knee extension    0 deg in 0 deg knee extension long sitting  Ankle plantarflexion     Ankle inversion 32 22 c pain 35  Ankle eversion 32 12 c pain 30   (Blank rows = not tested)  LOWER EXTREMITY SPECIAL TESTS:  05/01/2024 No specific testing performed.   FUNCTIONAL TESTS:  05/01/2024 18 inch chair transfer: able on 1st try with weight deviation to Rt due to pain.  Lt SLS no shoes:  unable unassisted, pain noted.  Rt SLS  no shoes: 30 seconds   GAIT: 05/01/2024 Independent ambulation c deviation in stance on Lt leg, lacking toe off progression due to antalgic gait, DF restriction.                                                                                                                                                                          TODAY'S TREATMENT                                                                          DATE: 05/23/2024 Therex: Nustep Lvl 5 8 mins UE/LE for ROM, mobility.  Incline gastroc stretch 30 sec x5 bilaterally  Review of HEP.  Cues for home use.    Neuro Re-ed [facility] pew anterior/posterior weight shift control feet close together x 20 each way Tandem stance  1 min x 2 bilaterally on ground with minimal HHA  Cues for home use.  Time spent in education of techniques.   Manual Lt talocrural anterior/posterior g3 jt mobs, inversion/eversion subtalar g3 jt mobs.  Noted improvement in ankle passive mobility today.    TODAY'S TREATMENT                                                                          DATE: 05/01/2024 Therex:    HEP instruction/performance c cues for techniques, handout provided.  Trial set performed of each for comprehension and symptom assessment.  See below for  exercise list  Manual: Lt anterior talocrural joint mobs g3 (limited by pain anterior ankle  Self Care Edema reduction education for elevation above heart, ankle pumps for muscle pumping action and icing prn.    PATIENT EDUCATION:  05/01/2024 Education details: HEP, POC Person educated: Patient Education method: Programmer, Multimedia, Demonstration, Verbal cues, and Handouts Education comprehension: verbalized understanding, returned demonstration, and verbal cues required  HOME EXERCISE PROGRAM: Access Code: [email or web address] URL: [email or web address] Date: 05/23/2024 Prepared by: Ozell Silvan  Exercises - Ankle Pumps in Elevation  - 2-3 x daily - 7 x weekly - 1 sets - 10 reps - Ankle Alphabet in Elevation  - 2-3 x daily - 7 x weekly - 1 sets - 2-3 reps - Long Sitting Calf Stretch with Strap  - 2-3 x daily - 7 x weekly - 1 sets - 3-5 reps - 30 hold - Long Sitting Ankle Eversion with Resistance  - 2 x daily - 7 x weekly - 3 sets - 10 reps - Long Sitting Ankle Plantar Flexion with Resistance  - 2 x daily - 7 x weekly - 3 sets - 10 reps - Long Sitting Ankle Inversion with Resistance  - 2 x daily - 7 x weekly - 3 sets - 10 reps - Long Sitting Ankle Dorsiflexion with Anchored Resistance (Mirrored)  - 1-2 x daily - 7 x weekly - 2-3 sets - 10-15 reps - [place]  - 1-2 x daily - 7 x weekly - 1 sets - 1-2 mins hold - Tandem Stance in Corner  - 1-2 x daily - 7 x weekly - 1 sets - 3-5 reps - 30 hold  ASSESSMENT:  CLINICAL IMPRESSION: Lt ankle mobility improved nicely compared to first day.  Progressed at home intervention with standing static balance as well as band muscle strengthening.  May continue to benefit from skilled PT services for progression in mobility, strength and balance.   OBJECTIVE IMPAIRMENTS: Abnormal gait, decreased activity tolerance, decreased balance, decreased coordination, decreased endurance, decreased mobility, difficulty walking, decreased ROM, decreased  strength, increased fascial restrictions, impaired perceived functional ability, impaired flexibility, improper body mechanics, and pain.   ACTIVITY LIMITATIONS: lifting, bending, sitting, standing, squatting, sleeping, stairs, transfers, bed mobility, and locomotion level  PARTICIPATION LIMITATIONS: meal prep, cleaning, laundry, interpersonal relationship, driving, shopping, community activity, and occupation  PERSONAL FACTORS: No specific factors are also affecting patient's functional outcome.   REHAB POTENTIAL: Good  CLINICAL DECISION MAKING: Stable/uncomplicated  EVALUATION COMPLEXITY: Low   GOALS: Goals reviewed with patient? Yes  SHORT TERM GOALS: (target date for Short term goals are 3 weeks 05/22/2024)   1.  Patient will demonstrate independent use of home exercise program to maintain progress from in clinic treatments.  Goal status: Met in review 05/23/2024  LONG TERM GOALS: (target dates for all long term goals are 10 weeks  07/10/2024 )   1. Patient will demonstrate/report pain at worst less than or equal to 2/10 to facilitate minimal limitation in daily activity secondary to pain symptoms.  Goal status: on going 05/23/2024   2. Patient will demonstrate independent use of home exercise program to facilitate ability to maintain/progress functional gains from skilled physical therapy services.  Goal status: on going 05/23/2024   3. Patient will demonstrate Patient specific functional scale avg > or = 8/10 to indicate reduced disability due to condition.   Goal status: on going 05/23/2024   4.  Patient will demonstrate Lt  LE MMT 5/5 throughout to faciltiate usual transfers, stairs, squatting at [facility] for daily life.   Goal status: on going 05/23/2024   5.  Patient will demonstrate Lt ankle AROM equal to Rt to facilitate mobility for ambulation, daily activity at PLOF.  Goal status: on going 05/23/2024   6. Patient will demonstrate ascending/descending stairs reciprocally  s UE assist for community integration.   Goal status: on going 05/23/2024   7.  Patient will report blase return to work.  Goal Status: on going 05/23/2024  8. Patient will demonstrate independent ambulation s deviation for normal recreational walking, work walking.  Goal status: on going 05/23/2024   PLAN:  PT FREQUENCY: 1-2x/week  PT DURATION: 10 weeks  PLANNED INTERVENTIONS: Can include 02853- PT Re-evaluation, 97110-Therapeutic exercises, 97530- Therapeutic activity, 97112- Neuromuscular re-education, 97535- Self Care, 97140- Manual therapy, [phone]- Gait training, [phone]- Orthotic Fit/training, [phone]- Canalith repositioning, [id number]- Aquatic Therapy, [phone]- Electrical stimulation (unattended), [id number] Physical performance testing, 97016- Vasopneumatic device, [id number]- Ultrasound, [id number]- Traction (mechanical), [id number]- Ionotophoresis 4mg /ml Dexamethasone,  20560 - Needle insertion w/o injection 1 or 2 muscles, 20561 - Needle insertion w/o injection 3 or more muscles.   Patient/Family education, Balance training, Stair training, Taping, Dry Needling, Joint mobilization, Joint manipulation, Spinal manipulation, Spinal mobilization, Scar mobilization, Vestibular training, Visual/preceptual remediation/compensation, DME instructions, Cryotherapy, and Moist heat.  All performed as medically necessary.  All included unless contraindicated  PLAN FOR NEXT SESSION: Static balance progression, strengthening.    Ozell Silvan, PT, DPT, OCS, ATC 05/23/24  10:47 AM  [employer] Auth needed.

## 2024-05-25 ENCOUNTER — Ambulatory Visit: Admitting: Rehabilitative and Restorative Service Providers"

## 2024-05-25 ENCOUNTER — Encounter: Payer: Self-pay | Admitting: Rehabilitative and Restorative Service Providers"

## 2024-05-25 DIAGNOSIS — R262 Difficulty in walking, not elsewhere classified: Secondary | ICD-10-CM

## 2024-05-25 DIAGNOSIS — M6281 Muscle weakness (generalized): Secondary | ICD-10-CM | POA: Diagnosis not present

## 2024-05-25 DIAGNOSIS — M25572 Pain in left ankle and joints of left foot: Secondary | ICD-10-CM | POA: Diagnosis not present

## 2024-05-25 DIAGNOSIS — M79605 Pain in left leg: Secondary | ICD-10-CM

## 2024-05-25 NOTE — Therapy (Signed)
 OUTPATIENT PHYSICAL THERAPY TREATMENT   Patient Name: Kari Pineda MRN: [id number] DOB:[birth year], 63 y.o., female Today's Date: 05/25/2024  END OF SESSION:  PT End of Session - 05/25/24 1010     Visit Number 3    Number of Visits 20    Date for Recertification  07/10/24    Authorization Type [employer] auth needed,   no copay    Authorization - Visit Number 3    Authorization - Number of Visits 20    Progress Note Due on Visit 10    PT Start Time 1013    PT Stop Time 1053    PT Time Calculation (min) 40 min    Activity Tolerance Patient limited by pain    Behavior During Therapy [facility] for tasks assessed/performed            History reviewed. No pertinent past medical history. Past Surgical History:  Procedure Laterality Date   APPENDECTOMY     CHOLECYSTECTOMY     TONSILLECTOMY     WRIST SURGERY     There are no active problems to display for this patient.   PCP: Burney Pao MD  REFERRING PROVIDER: Gerome Maurilio HERO, PA-C  REFERRING DIAG: [phone] (ICD-10-CM) - Impingement of anterior part of ankle M77.9 (ICD-10-CM) - Tendonitis  THERAPY DIAG:  Pain in left ankle and joints of left foot  Pain in left leg  Muscle weakness (generalized)  Difficulty in walking, not elsewhere classified  Rationale for Evaluation and Treatment: Rehabilitation  ONSET DATE:  02/11/2024  SUBJECTIVE:   SUBJECTIVE STATEMENT: Pt indicated feeling good after last visit until late evening.  Pt indicated some throbbing noted.  Then it got tight after.  Pt indicated symptoms got up to 8/10.  Pt inidcated 4/10 upon arrival today.  Was limited in some exercise activity.   Reported symptoms on top of foot.   PERTINENT HISTORY:   PAIN:  NPRS scale: 4/10 today.  Pain location: Lt ankle anteriorly.  Can shoot up leg.  Pain description: sharp, shooting.  Aggravating factors: prolonged WB activity, walking, standing.  Relieving factors: rest, inversion.   PRECAUTIONS:  None  WEIGHT BEARING RESTRICTIONS: No  FALLS:  Has patient fallen in last 6 months? No  LIVING ENVIRONMENT: Lives in: House/apartment Stairs: flight of stairs to bedroom.   No stairs in garage.  Front porch with 3 stairs with bilateral rails.    OCCUPATION: [employer] (unable to work since injury.   PLOF: Independent, hiking, play softball/basketball.   PATIENT GOALS: Reduce pain, get back to activities.  Walk normal.  OBJECTIVE:   DIAGNOSTIC FINDINGS:  X rays 03/06/2024 B knee with medial joint line narrowing Ankle no fracture noted, mortis intact  PATIENT SURVEYS:  Patient-Specific Activity Scoring Scheme  0 represents unable to perform. 10 represents able to perform at prior level. 0 1 2 3 4 5 6 7 8 9  10 (Date and Score)   Activity Eval  05/01/2024    1. Hike 3     2. Walk outside  3    3. Driving 4   4. Clean house 4   5. work 2   Score 3.2    Total score = sum of the activity scores/number of activities Minimum detectable change (90%CI) for average score = 2 points Minimum detectable change (90%CI) for single activity score = 3 points  COGNITION: 05/01/2024 Overall cognitive status: WFL    SENSATION: 05/01/2024 Not specific tested for dermatomes.   EDEMA:  05/01/2024 Figure 8 Lt ankle:  49 cm Figure 8 Rt ankle: 49 cms  MUSCLE LENGTH: 05/01/2024 No specific testing today.   POSTURE:  05/01/2024 Deviation off Lt leg in standing posture.   PALPATION: 05/01/2024 Tenderness anterior joint line Lt talocrural joint, lateral joint line inferior to lateral malleolus.   LOWER EXTREMITY MMT:    Right Eval 05/01/2024 Left Eval 05/01/2024  Hip flexion    Hip extension    Hip abduction    Hip adduction    Hip internal rotation    Hip external rotation    Knee flexion 5/5 4/5  Knee extension 5/5 4/5  Ankle dorsiflexion 5/5 3+/5  Ankle plantarflexion  2+/5  Ankle inversion 5/5 3+/5  Ankle eversion 5/5 3+/5   (Blank rows = not  tested)  LOWER EXTREMITY ROM:   Right Eval 05/01/2024 Left Eval 05/01/2024 Left 05/23/2024 AROM  Hip flexion     Hip extension     Hip abduction     Hip adduction     Hip internal rotation     Hip external rotation     Knee flexion     Knee extension     Ankle dorsiflexion 13 AROM in 90 deg knee flexion -9 AROM in 90 deg in knee flexion With pain  -11 AROM in 0 deg knee extension    0 deg in 0 deg knee extension long sitting  Ankle plantarflexion     Ankle inversion 32 22 c pain 35  Ankle eversion 32 12 c pain 30   (Blank rows = not tested)  LOWER EXTREMITY SPECIAL TESTS:  05/01/2024 No specific testing performed.   FUNCTIONAL TESTS:  05/01/2024 18 inch chair transfer: able on 1st try with weight deviation to Rt due to pain.  Lt SLS no shoes:  unable unassisted, pain noted.  Rt SLS  no shoes: 30 seconds   GAIT: 05/01/2024 Independent ambulation c deviation in stance on Lt leg, lacking toe off progression due to antalgic gait, DF restriction.                                                                                                                                                                          TODAY'S TREATMENT                                                                          DATE: 05/25/2024 Therex: Nustep Lvl 6 10 mins UE/LE for ROM, mobility.  Long sitting DF isometric to  pain tolerance 5 sec hold x 10 Lt ankle Green band inversion 2 x 10, eversion 2 x 10 , PF 2 x 10 Lt ankle in long sitting Incline gastroc stretch 30 sec x3 bilaterally   Neuro Re-ed (muscle activation, coordination) BAPS seated Lt leg lvl 2 fwd/back x 20 light touching, side to side x 20 each, circles cw x 10, ccw x 10  Time spent in education of techniques   Manual Lt talocrural anterior/posterior g3 jt mobs, inversion/eversion subtalar g3 jt mobs   TODAY'S TREATMENT                                                                          DATE:  05/23/2024 Therex: Nustep Lvl 5 8 mins UE/LE for ROM, mobility.  Incline gastroc stretch 30 sec x5 bilaterally  4 way 2 x 10 green band.  Review of HEP.  Cues for home use.    Neuro Re-ed [facility] pew anterior/posterior weight shift control feet close together x 20 each way Tandem stance 1 min x 2 bilaterally on ground with minimal HHA  Cues for home use.  Time spent in education of techniques.   Manual Lt talocrural anterior/posterior g3 jt mobs, inversion/eversion subtalar g3 jt mobs.  Noted improvement in ankle passive mobility today.    TODAY'S TREATMENT                                                                          DATE: 05/01/2024 Therex:    HEP instruction/performance c cues for techniques, handout provided.  Trial set performed of each for comprehension and symptom assessment.  See below for exercise list  Manual: Lt anterior talocrural joint mobs g3 (limited by pain anterior ankle  Self Care Edema reduction education for elevation above heart, ankle pumps for muscle pumping action and icing prn.    PATIENT EDUCATION:  05/01/2024 Education details: HEP, POC Person educated: Patient Education method: Programmer, Multimedia, Demonstration, Verbal cues, and Handouts Education comprehension: verbalized understanding, returned demonstration, and verbal cues required  HOME EXERCISE PROGRAM: Access Code: [email or web address] URL: [email or web address] Date: 05/23/2024 Prepared by: Ozell Silvan  Exercises - Ankle Pumps in Elevation  - 2-3 x daily - 7 x weekly - 1 sets - 10 reps - Ankle Alphabet in Elevation  - 2-3 x daily - 7 x weekly - 1 sets - 2-3 reps - Long Sitting Calf Stretch with Strap  - 2-3 x daily - 7 x weekly - 1 sets - 3-5 reps - 30 hold - Long Sitting Ankle Eversion with Resistance  - 2 x daily - 7 x weekly - 3 sets - 10 reps - Long Sitting Ankle Plantar Flexion with Resistance  - 2 x daily - 7 x weekly - 3 sets - 10 reps - Long Sitting Ankle Inversion with  Resistance  - 2 x daily - 7 x weekly - 3 sets - 10 reps - Long Sitting Ankle Dorsiflexion with Anchored Resistance (Mirrored)  -  1-2 x daily - 7 x weekly - 2-3 sets - 10-15 reps - [place]  - 1-2 x daily - 7 x weekly - 1 sets - 1-2 mins hold - Tandem Stance in Corner  - 1-2 x daily - 7 x weekly - 1 sets - 3-5 reps - 30 hold  ASSESSMENT:  CLINICAL IMPRESSION: Complaints in active DF and end range DF noted more irritable today compared to last visit.  Detailed return use of isometrics as necessary if band activity at home too irritating.   Limiting WB activity to manage symptom response.   OBJECTIVE IMPAIRMENTS: Abnormal gait, decreased activity tolerance, decreased balance, decreased coordination, decreased endurance, decreased mobility, difficulty walking, decreased ROM, decreased strength, increased fascial restrictions, impaired perceived functional ability, impaired flexibility, improper body mechanics, and pain.   ACTIVITY LIMITATIONS: lifting, bending, sitting, standing, squatting, sleeping, stairs, transfers, bed mobility, and locomotion level  PARTICIPATION LIMITATIONS: meal prep, cleaning, laundry, interpersonal relationship, driving, shopping, community activity, and occupation  PERSONAL FACTORS: No specific factors are also affecting patient's functional outcome.   REHAB POTENTIAL: Good  CLINICAL DECISION MAKING: Stable/uncomplicated  EVALUATION COMPLEXITY: Low   GOALS: Goals reviewed with patient? Yes  SHORT TERM GOALS: (target date for Short term goals are 3 weeks 05/22/2024)   1.  Patient will demonstrate independent use of home exercise program to maintain progress from in clinic treatments.  Goal status: Met in review 05/23/2024  LONG TERM GOALS: (target dates for all long term goals are 10 weeks  07/10/2024 )   1. Patient will demonstrate/report pain at worst less than or equal to 2/10 to facilitate minimal limitation in daily activity secondary to pain  symptoms.  Goal status: on going 05/23/2024   2. Patient will demonstrate independent use of home exercise program to facilitate ability to maintain/progress functional gains from skilled physical therapy services.  Goal status: on going 05/23/2024   3. Patient will demonstrate Patient specific functional scale avg > or = 8/10 to indicate reduced disability due to condition.   Goal status: on going 05/23/2024   4.  Patient will demonstrate Lt  LE MMT 5/5 throughout to faciltiate usual transfers, stairs, squatting at [facility] for daily life.   Goal status: on going 05/23/2024   5.  Patient will demonstrate Lt ankle AROM equal to Rt to facilitate mobility for ambulation, daily activity at PLOF.  Goal status: on going 05/23/2024   6. Patient will demonstrate ascending/descending stairs reciprocally s UE assist for community integration.   Goal status: on going 05/23/2024   7.  Patient will report blase return to work.  Goal Status: on going 05/23/2024  8. Patient will demonstrate independent ambulation s deviation for normal recreational walking, work walking.  Goal status: on going 05/23/2024   PLAN:  PT FREQUENCY: 1-2x/week  PT DURATION: 10 weeks  PLANNED INTERVENTIONS: Can include 02853- PT Re-evaluation, 97110-Therapeutic exercises, 97530- Therapeutic activity, 97112- Neuromuscular re-education, 97535- Self Care, 97140- Manual therapy, [phone]- Gait training, [phone]- Orthotic Fit/training, [phone]- Canalith repositioning, [id number]- Aquatic Therapy, [phone]- Electrical stimulation (unattended), [id number] Physical performance testing, 97016- Vasopneumatic device, [id number]- Ultrasound, [id number]- Traction (mechanical), [id number]- Ionotophoresis 4mg /ml Dexamethasone,  20560 - Needle insertion w/o injection 1 or 2 muscles, 20561 - Needle insertion w/o injection 3 or more muscles.   Patient/Family education, Balance training, Stair training, Taping, Dry Needling, Joint mobilization, Joint manipulation, Spinal  manipulation, Spinal mobilization, Scar mobilization, Vestibular training, Visual/preceptual remediation/compensation, DME instructions, Cryotherapy, and Moist heat.  All performed as medically necessary.  All included  unless contraindicated  PLAN FOR NEXT SESSION: DF mobility and strength gains as able.  Standing balance as able.    Ozell Silvan, PT, DPT, OCS, ATC 05/25/2024  10:51 AM    [employer] Auth needed.

## 2024-05-30 ENCOUNTER — Encounter: Payer: Self-pay | Admitting: Rehabilitative and Restorative Service Providers"

## 2024-05-30 ENCOUNTER — Ambulatory Visit: Admitting: Rehabilitative and Restorative Service Providers"

## 2024-05-30 DIAGNOSIS — M6281 Muscle weakness (generalized): Secondary | ICD-10-CM

## 2024-05-30 DIAGNOSIS — R262 Difficulty in walking, not elsewhere classified: Secondary | ICD-10-CM

## 2024-05-30 DIAGNOSIS — M79605 Pain in left leg: Secondary | ICD-10-CM

## 2024-05-30 DIAGNOSIS — M25572 Pain in left ankle and joints of left foot: Secondary | ICD-10-CM

## 2024-05-30 NOTE — Therapy (Signed)
 OUTPATIENT PHYSICAL THERAPY TREATMENT   Patient Name: Kari Pineda MRN: [id number] DOB:Feb 03, 1961, 63 y.o., female Today's Date: 05/30/2024  END OF SESSION:  PT End of Session - 05/30/24 1016     Visit Number 4    Number of Visits 20    Date for Recertification  07/10/24    Authorization Type [employer] auth needed,   no copay    Authorization - Visit Number 4    Authorization - Number of Visits 20    Progress Note Due on Visit 10    PT Start Time 1012    PT Stop Time 1051    PT Time Calculation (min) 39 min    Activity Tolerance Patient tolerated treatment well    Behavior During Therapy [facility] for tasks assessed/performed             History reviewed. No pertinent past medical history. Past Surgical History:  Procedure Laterality Date   APPENDECTOMY     CHOLECYSTECTOMY     TONSILLECTOMY     WRIST SURGERY     There are no active problems to display for this patient.   PCP: Burney Pao MD  REFERRING PROVIDER: Gerome Maurilio HERO, PA-C  REFERRING DIAG: [phone] (ICD-10-CM) - Impingement of anterior part of ankle M77.9 (ICD-10-CM) - Tendonitis  THERAPY DIAG:  Pain in left ankle and joints of left foot  Pain in left leg  Muscle weakness (generalized)  Difficulty in walking, not elsewhere classified  Rationale for Evaluation and Treatment: Rehabilitation  ONSET DATE:  02/11/2024  SUBJECTIVE:   SUBJECTIVE STATEMENT: Pt indicated less complaints of pain this time vs. Last time.  Pt indicated another instance of a pop with ankle twist that had pain in foot/leg but short term.  Pt indicated   PERTINENT HISTORY:   PAIN:  NPRS scale:0/10 Pain location: Lt ankle anteriorly.  Can shoot up leg.  Pain description: sharp, shooting.  Aggravating factors: prolonged WB activity, walking, standing.  Relieving factors: rest, inversion.   PRECAUTIONS: None  WEIGHT BEARING RESTRICTIONS: No  FALLS:  Has patient fallen in last 6 months? No  LIVING  ENVIRONMENT: Lives in: House/apartment Stairs: flight of stairs to bedroom.   No stairs in garage.  Front porch with 3 stairs with bilateral rails.    OCCUPATION: [employer] (unable to work since injury.   PLOF: Independent, hiking, play softball/basketball.   PATIENT GOALS: Reduce pain, get back to activities.  Walk normal.  OBJECTIVE:   DIAGNOSTIC FINDINGS:  X rays 03/06/2024 B knee with medial joint line narrowing Ankle no fracture noted, mortis intact  PATIENT SURVEYS:  Patient-Specific Activity Scoring Scheme  0 represents unable to perform. 10 represents able to perform at prior level. 0 1 2 3 4 5 6 7 8 9  10 (Date and Score)   Activity Eval  05/01/2024  05/30/2024  1. Hike 3   6  2. Walk outside  3  10  3. Driving 4 7  4. Clean house 4 10  5. work 2 6  Score 3.2 7.8   Total score = sum of the activity scores/number of activities Minimum detectable change (90%CI) for average score = 2 points Minimum detectable change (90%CI) for single activity score = 3 points  COGNITION: 05/01/2024 Overall cognitive status: WFL    SENSATION: 05/01/2024 Not specific tested for dermatomes.   EDEMA:  05/01/2024 Figure 8 Lt ankle: 49 cm Figure 8 Rt ankle: 49 cms  MUSCLE LENGTH: 05/01/2024 No specific testing today.   POSTURE:  05/01/2024  Deviation off Lt leg in standing posture.   PALPATION: 05/01/2024 Tenderness anterior joint line Lt talocrural joint, lateral joint line inferior to lateral malleolus.   LOWER EXTREMITY MMT:    Right Eval 05/01/2024 Left Eval 05/01/2024 Left 05/30/2024  Hip flexion     Hip extension     Hip abduction     Hip adduction     Hip internal rotation     Hip external rotation     Knee flexion 5/5 4/5 5/5  Knee extension 5/5 4/5 5/5  Ankle dorsiflexion 5/5 3+/5 4+/5  Ankle plantarflexion  2+/5   Ankle inversion 5/5 3+/5 5/5  Ankle eversion 5/5 3+/5 4/5   (Blank rows = not tested)  LOWER EXTREMITY ROM:    Right Eval 05/01/2024 Left Eval 05/01/2024 Left 05/23/2024 AROM  Hip flexion     Hip extension     Hip abduction     Hip adduction     Hip internal rotation     Hip external rotation     Knee flexion     Knee extension     Ankle dorsiflexion 13 AROM in 90 deg knee flexion -9 AROM in 90 deg in knee flexion With pain  -11 AROM in 0 deg knee extension    0 deg in 0 deg knee extension long sitting  Ankle plantarflexion     Ankle inversion 32 22 c pain 35  Ankle eversion 32 12 c pain 30   (Blank rows = not tested)  LOWER EXTREMITY SPECIAL TESTS:  05/01/2024 No specific testing performed.   FUNCTIONAL TESTS:  05/01/2024 18 inch chair transfer: able on 1st try with weight deviation to Rt due to pain.  Lt SLS no shoes:  unable unassisted, pain noted.  Rt SLS  no shoes: 30 seconds   GAIT: 05/01/2024 Independent ambulation c deviation in stance on Lt leg, lacking toe off progression due to antalgic gait, DF restriction.                                                                                                                                                                          TODAY'S TREATMENT                                                                          DATE: 12/16/22025 Therex: Nustep Lvl 6 10 mins UE/LE for Rom, endurance.  Incilne gastroc stretch 30 sec x 5 bilaterally performed DF stretch on  incline board with lunge x 10  BAPS seated Lt leg lvl 2 fwd/back x 20 light touching, side to side x 20 each, circles cw x 15, ccw x 15    Neuro Re-ed (muscle activation, coordination) Tandem stance on ground with occasional HHA on bar, SBA 1 min x 1 bilaterally, on foam 1 min x 1 bilaterally with mild increase in HHA Light hand assist on bar alternating PF/DF 2 x 15      TODAY'S TREATMENT                                                                          DATE: 05/25/2024 Therex: Nustep Lvl 6 10 mins UE/LE for ROM, mobility.  Long sitting DF  isometric to pain tolerance 5 sec hold x 10 Lt ankle Green band inversion 2 x 10, eversion 2 x 10 , PF 2 x 10 Lt ankle in long sitting Incline gastroc stretch 30 sec x3 bilaterally   Neuro Re-ed (muscle activation, coordination) BAPS seated Lt leg lvl 2 fwd/back x 20 light touching, side to side x 20 each, circles cw x 10, ccw x 10  Time spent in education of techniques   Manual Lt talocrural anterior/posterior g3 jt mobs, inversion/eversion subtalar g3 jt mobs   TODAY'S TREATMENT                                                                          DATE: 05/23/2024 Therex: Nustep Lvl 5 8 mins UE/LE for ROM, mobility.  Incline gastroc stretch 30 sec x5 bilaterally  4 way 2 x 10 green band.  Review of HEP.  Cues for home use.    Neuro Re-ed [facility] pew anterior/posterior weight shift control feet close together x 20 each way Tandem stance 1 min x 2 bilaterally on ground with minimal HHA  Cues for home use.  Time spent in education of techniques.   Manual Lt talocrural anterior/posterior g3 jt mobs, inversion/eversion subtalar g3 jt mobs.  Noted improvement in ankle passive mobility today.    TODAY'S TREATMENT                                                                          DATE: 05/01/2024 Therex:    HEP instruction/performance c cues for techniques, handout provided.  Trial set performed of each for comprehension and symptom assessment.  See below for exercise list  Manual: Lt anterior talocrural joint mobs g3 (limited by pain anterior ankle  Self Care Edema reduction education for elevation above heart, ankle pumps for muscle pumping action and icing prn.    PATIENT EDUCATION:  05/01/2024 Education details: HEP, POC Person educated: Patient Education method: Programmer, Multimedia, Demonstration,  Verbal cues, and Handouts Education comprehension: verbalized understanding, returned demonstration, and verbal cues required  HOME EXERCISE PROGRAM: Access Code:  [email or web address] URL: [email or web address] Date: 05/23/2024 Prepared by: Ozell Silvan  Exercises - Ankle Pumps in Elevation  - 2-3 x daily - 7 x weekly - 1 sets - 10 reps - Ankle Alphabet in Elevation  - 2-3 x daily - 7 x weekly - 1 sets - 2-3 reps - Long Sitting Calf Stretch with Strap  - 2-3 x daily - 7 x weekly - 1 sets - 3-5 reps - 30 hold - Long Sitting Ankle Eversion with Resistance  - 2 x daily - 7 x weekly - 3 sets - 10 reps - Long Sitting Ankle Plantar Flexion with Resistance  - 2 x daily - 7 x weekly - 3 sets - 10 reps - Long Sitting Ankle Inversion with Resistance  - 2 x daily - 7 x weekly - 3 sets - 10 reps - Long Sitting Ankle Dorsiflexion with Anchored Resistance (Mirrored)  - 1-2 x daily - 7 x weekly - 2-3 sets - 10-15 reps - [place]  - 1-2 x daily - 7 x weekly - 1 sets - 1-2 mins hold - Tandem Stance in Corner  - 1-2 x daily - 7 x weekly - 1 sets - 3-5 reps - 30 hold  ASSESSMENT:  CLINICAL IMPRESSION: Reduction in pain complaints between visits.  Pop in clinic with DF stretching helped improve mobility quality. Fair control in compliant surface balance attempts.  Strength update showed improvement.  Continued skilled PT services indicated.   OBJECTIVE IMPAIRMENTS: Abnormal gait, decreased activity tolerance, decreased balance, decreased coordination, decreased endurance, decreased mobility, difficulty walking, decreased ROM, decreased strength, increased fascial restrictions, impaired perceived functional ability, impaired flexibility, improper body mechanics, and pain.   ACTIVITY LIMITATIONS: lifting, bending, sitting, standing, squatting, sleeping, stairs, transfers, bed mobility, and locomotion level  PARTICIPATION LIMITATIONS: meal prep, cleaning, laundry, interpersonal relationship, driving, shopping, community activity, and occupation  PERSONAL FACTORS: No specific factors are also affecting patient's functional outcome.   REHAB POTENTIAL:  Good  CLINICAL DECISION MAKING: Stable/uncomplicated  EVALUATION COMPLEXITY: Low   GOALS: Goals reviewed with patient? Yes  SHORT TERM GOALS: (target date for Short term goals are 3 weeks 05/22/2024)   1.  Patient will demonstrate independent use of home exercise program to maintain progress from in clinic treatments.  Goal status: Met in review 05/23/2024  LONG TERM GOALS: (target dates for all long term goals are 10 weeks  07/10/2024 )   1. Patient will demonstrate/report pain at worst less than or equal to 2/10 to facilitate minimal limitation in daily activity secondary to pain symptoms.  Goal status: on going 05/23/2024   2. Patient will demonstrate independent use of home exercise program to facilitate ability to maintain/progress functional gains from skilled physical therapy services.  Goal status: on going 05/23/2024   3. Patient will demonstrate Patient specific functional scale avg > or = 8/10 to indicate reduced disability due to condition.   Goal status: on going 05/23/2024   4.  Patient will demonstrate Lt  LE MMT 5/5 throughout to faciltiate usual transfers, stairs, squatting at [facility] for daily life.   Goal status: on going 05/23/2024   5.  Patient will demonstrate Lt ankle AROM equal to Rt to facilitate mobility for ambulation, daily activity at PLOF.  Goal status: on going 05/23/2024   6. Patient will demonstrate ascending/descending stairs reciprocally s UE assist for community integration.   Goal  status: on going 05/23/2024   7.  Patient will report blase return to work.  Goal Status: on going 05/23/2024  8. Patient will demonstrate independent ambulation s deviation for normal recreational walking, work walking.  Goal status: on going 05/23/2024   PLAN:  PT FREQUENCY: 1-2x/week  PT DURATION: 10 weeks  PLANNED INTERVENTIONS: Can include 02853- PT Re-evaluation, 97110-Therapeutic exercises, 97530- Therapeutic activity, 97112- Neuromuscular  re-education, 97535- Self Care, 97140- Manual therapy, [phone]- Gait training, [phone]- Orthotic Fit/training, [phone]- Canalith repositioning, [id number]- Aquatic Therapy, [phone]- Electrical stimulation (unattended), [id number] Physical performance testing, 97016- Vasopneumatic device, [id number]- Ultrasound, [id number]- Traction (mechanical), [id number]- Ionotophoresis 4mg /ml Dexamethasone,  20560 - Needle insertion w/o injection 1 or 2 muscles, 20561 - Needle insertion w/o injection 3 or more muscles.   Patient/Family education, Balance training, Stair training, Taping, Dry Needling, Joint mobilization, Joint manipulation, Spinal manipulation, Spinal mobilization, Scar mobilization, Vestibular training, Visual/preceptual remediation/compensation, DME instructions, Cryotherapy, and Moist heat.  All performed as medically necessary.  All included unless contraindicated  PLAN FOR NEXT SESSION: Recheck range of motion.    Ozell Silvan, PT, DPT, OCS, ATC 05/30/2024  10:52 AM    [employer] Auth needed.

## 2024-06-01 ENCOUNTER — Encounter: Admitting: Rehabilitative and Restorative Service Providers"

## 2024-06-05 ENCOUNTER — Ambulatory Visit: Admitting: Rehabilitative and Restorative Service Providers"

## 2024-06-05 ENCOUNTER — Encounter: Payer: Self-pay | Admitting: Rehabilitative and Restorative Service Providers"

## 2024-06-05 DIAGNOSIS — M6281 Muscle weakness (generalized): Secondary | ICD-10-CM

## 2024-06-05 DIAGNOSIS — R262 Difficulty in walking, not elsewhere classified: Secondary | ICD-10-CM

## 2024-06-05 DIAGNOSIS — M79605 Pain in left leg: Secondary | ICD-10-CM

## 2024-06-05 DIAGNOSIS — M25572 Pain in left ankle and joints of left foot: Secondary | ICD-10-CM

## 2024-06-05 NOTE — Therapy (Signed)
 " OUTPATIENT PHYSICAL THERAPY TREATMENT   Patient Name: Kari Pineda MRN: [id number] DOB:Aug 23, 1960, 63 y.o., female Today's Date: 06/05/2024  END OF SESSION:  PT End of Session - 06/05/24 1056     Visit Number 5    Number of Visits 20    Date for Recertification  07/10/24    Authorization Type [employer] auth needed,   no copay    Authorization - Visit Number 5    Authorization - Number of Visits 20    Progress Note Due on Visit 10    PT Start Time 1053    PT Stop Time 1134    PT Time Calculation (min) 41 min    Activity Tolerance Patient tolerated treatment well    Behavior During Therapy [facility] for tasks assessed/performed              History reviewed. No pertinent past medical history. Past Surgical History:  Procedure Laterality Date   APPENDECTOMY     CHOLECYSTECTOMY     TONSILLECTOMY     WRIST SURGERY     There are no active problems to display for this patient.   PCP: Burney Pao MD  REFERRING PROVIDER: Gerome Maurilio HERO, PA-C  REFERRING DIAG: [phone] (ICD-10-CM) - Impingement of anterior part of ankle M77.9 (ICD-10-CM) - Tendonitis  THERAPY DIAG:  Pain in left ankle and joints of left foot  Pain in left leg  Muscle weakness (generalized)  Difficulty in walking, not elsewhere classified  Rationale for Evaluation and Treatment: Rehabilitation  ONSET DATE:  02/11/2024  SUBJECTIVE:   SUBJECTIVE STATEMENT: Pt indicated doing better overall.  Reported Foot still wants to lie on side when sitting.   PERTINENT HISTORY:   PAIN:  NPRS scale:0/10 Pain location: Lt ankle anteriorly.  Can shoot up leg.  Pain description: sharp, shooting.  Aggravating factors: prolonged WB activity, walking, standing.  Relieving factors: rest, inversion.   PRECAUTIONS: None  WEIGHT BEARING RESTRICTIONS: No  FALLS:  Has patient fallen in last 6 months? No  LIVING ENVIRONMENT: Lives in: House/apartment Stairs: flight of stairs to bedroom.   No stairs in  garage.  Front porch with 3 stairs with bilateral rails.    OCCUPATION: [employer] (unable to work since injury.   PLOF: Independent, hiking, play softball/basketball.   PATIENT GOALS: Reduce pain, get back to activities.  Walk normal.  OBJECTIVE:   DIAGNOSTIC FINDINGS:  X rays 03/06/2024 B knee with medial joint line narrowing Ankle no fracture noted, mortis intact  PATIENT SURVEYS:  Patient-Specific Activity Scoring Scheme  0 represents unable to perform. 10 represents able to perform at prior level. 0 1 2 3 4 5 6 7 8 9  10 (Date and Score)   Activity Eval  05/01/2024  05/30/2024  1. Hike 3   6  2. Walk outside  3  10  3. Driving 4 7  4. Clean house 4 10  5. work 2 6  Score 3.2 7.8   Total score = sum of the activity scores/number of activities Minimum detectable change (90%CI) for average score = 2 points Minimum detectable change (90%CI) for single activity score = 3 points  COGNITION: 05/01/2024 Overall cognitive status: WFL    SENSATION: 05/01/2024 Not specific tested for dermatomes.   EDEMA:  05/01/2024 Figure 8 Lt ankle: 49 cm Figure 8 Rt ankle: 49 cms  MUSCLE LENGTH: 05/01/2024 No specific testing today.   POSTURE:  05/01/2024 Deviation off Lt leg in standing posture.   PALPATION: 05/01/2024 Tenderness anterior joint line  Lt talocrural joint, lateral joint line inferior to lateral malleolus.   LOWER EXTREMITY MMT:    Right Eval 05/01/2024 Left Eval 05/01/2024 Left 05/30/2024  Hip flexion     Hip extension     Hip abduction     Hip adduction     Hip internal rotation     Hip external rotation     Knee flexion 5/5 4/5 5/5  Knee extension 5/5 4/5 5/5  Ankle dorsiflexion 5/5 3+/5 4+/5  Ankle plantarflexion  2+/5   Ankle inversion 5/5 3+/5 5/5  Ankle eversion 5/5 3+/5 4/5   (Blank rows = not tested)  LOWER EXTREMITY ROM:   Right Eval 05/01/2024 Left Eval 05/01/2024 Left 05/23/2024 AROM Left 06/05/2024  Hip  flexion      Hip extension      Hip abduction      Hip adduction      Hip internal rotation      Hip external rotation      Knee flexion      Knee extension      Ankle dorsiflexion 13 AROM in 90 deg knee flexion -9 AROM in 90 deg in knee flexion With pain  -11 AROM in 0 deg knee extension    0 deg in 0 deg knee extension long sitting 6 deg in 90 deg knee flexion   6 deg in 0 deg knee flexion   Ankle plantarflexion      Ankle inversion 32 22 c pain 35 40  Ankle eversion 32 12 c pain 30 40   (Blank rows = not tested)  LOWER EXTREMITY SPECIAL TESTS:  05/01/2024 No specific testing performed.   FUNCTIONAL TESTS:  05/01/2024 18 inch chair transfer: able on 1st try with weight deviation to Rt due to pain.  Lt SLS no shoes:  unable unassisted, pain noted.  Rt SLS  no shoes: 30 seconds   GAIT: 05/01/2024 Independent ambulation c deviation in stance on Lt leg, lacking toe off progression due to antalgic gait, DF restriction.                                                                                                                                                                          TODAY'S TREATMENT                                                                          DATE: 06/05/2024 Therex: Nustep Lvl 6 10 mins UE/LE for  ROM, endurance Incilne gastroc stretch 30sec x 5 bilateral BAPS seated Lt leg lvl 3 fwd/back x 20 light touching, side to side x 20 each, circles cw x 15, ccw x 15    Neuro Re-ed (muscle activation, coordination) Tandem stance on foam in // bars with occasional HHA 1 min x 2 bilaterally  Tandem ambulation on foam fwd/back with occasional HHA 6 ft x 6 each way Feet together stance across foam bar 1 min     TODAY'S TREATMENT                                                                          DATE: 05/30/2024 Therex: Nustep Lvl 6 10 mins UE/LE for Rom, endurance.  Incilne gastroc stretch 30 sec x 5 bilaterally performed DF stretch on  incline board with lunge x 10  BAPS seated Lt leg lvl 2 fwd/back x 20 light touching, side to side x 20 each, circles cw x 15, ccw x 15    Neuro Re-ed (muscle activation, coordination) Tandem stance on ground with occasional HHA on bar, SBA 1 min x 1 bilaterally, on foam 1 min x 1 bilaterally with mild increase in HHA Light hand assist on bar alternating PF/DF 2 x 15      TODAY'S TREATMENT                                                                          DATE: 05/25/2024 Therex: Nustep Lvl 6 10 mins UE/LE for ROM, mobility.  Long sitting DF isometric to pain tolerance 5 sec hold x 10 Lt ankle Green band inversion 2 x 10, eversion 2 x 10 , PF 2 x 10 Lt ankle in long sitting Incline gastroc stretch 30 sec x3 bilaterally   Neuro Re-ed (muscle activation, coordination) BAPS seated Lt leg lvl 2 fwd/back x 20 light touching, side to side x 20 each, circles cw x 10, ccw x 10  Time spent in education of techniques   Manual Lt talocrural anterior/posterior g3 jt mobs, inversion/eversion subtalar g3 jt mobs   TODAY'S TREATMENT                                                                          DATE: 05/23/2024 Therex: Nustep Lvl 5 8 mins UE/LE for ROM, mobility.  Incline gastroc stretch 30 sec x5 bilaterally  4 way 2 x 10 green band.  Review of HEP.  Cues for home use.    Neuro Re-ed [facility] pew anterior/posterior weight shift control feet close together x 20 each way Tandem stance 1 min x 2 bilaterally on ground with minimal HHA  Cues for home use.  Time spent in education  of techniques.   Manual Lt talocrural anterior/posterior g3 jt mobs, inversion/eversion subtalar g3 jt mobs.  Noted improvement in ankle passive mobility today.    TODAY'S TREATMENT                                                                          DATE: 05/01/2024 Therex:    HEP instruction/performance c cues for techniques, handout provided.  Trial set performed of each for comprehension and  symptom assessment.  See below for exercise list  Manual: Lt anterior talocrural joint mobs g3 (limited by pain anterior ankle  Self Care Edema reduction education for elevation above heart, ankle pumps for muscle pumping action and icing prn.    PATIENT EDUCATION:  05/01/2024 Education details: HEP, POC Person educated: Patient Education method: Programmer, Multimedia, Demonstration, Verbal cues, and Handouts Education comprehension: verbalized understanding, returned demonstration, and verbal cues required  HOME EXERCISE PROGRAM: Access Code: [email or web address] URL: [email or web address] Date: 05/23/2024 Prepared by: Ozell Silvan  Exercises - Ankle Pumps in Elevation  - 2-3 x daily - 7 x weekly - 1 sets - 10 reps - Ankle Alphabet in Elevation  - 2-3 x daily - 7 x weekly - 1 sets - 2-3 reps - Long Sitting Calf Stretch with Strap  - 2-3 x daily - 7 x weekly - 1 sets - 3-5 reps - 30 hold - Long Sitting Ankle Eversion with Resistance  - 2 x daily - 7 x weekly - 3 sets - 10 reps - Long Sitting Ankle Plantar Flexion with Resistance  - 2 x daily - 7 x weekly - 3 sets - 10 reps - Long Sitting Ankle Inversion with Resistance  - 2 x daily - 7 x weekly - 3 sets - 10 reps - Long Sitting Ankle Dorsiflexion with Anchored Resistance (Mirrored)  - 1-2 x daily - 7 x weekly - 2-3 sets - 10-15 reps - [place]  - 1-2 x daily - 7 x weekly - 1 sets - 1-2 mins hold - Tandem Stance in Corner  - 1-2 x daily - 7 x weekly - 1 sets - 3-5 reps - 30 hold  ASSESSMENT:  CLINICAL IMPRESSION: DF mobility continued to show improvement in reassessment today.  Will continue to benefit from strengthening and movement coordination improvements for stability.   OBJECTIVE IMPAIRMENTS: Abnormal gait, decreased activity tolerance, decreased balance, decreased coordination, decreased endurance, decreased mobility, difficulty walking, decreased ROM, decreased strength, increased fascial restrictions, impaired perceived  functional ability, impaired flexibility, improper body mechanics, and pain.   ACTIVITY LIMITATIONS: lifting, bending, sitting, standing, squatting, sleeping, stairs, transfers, bed mobility, and locomotion level  PARTICIPATION LIMITATIONS: meal prep, cleaning, laundry, interpersonal relationship, driving, shopping, community activity, and occupation  PERSONAL FACTORS: No specific factors are also affecting patient's functional outcome.   REHAB POTENTIAL: Good  CLINICAL DECISION MAKING: Stable/uncomplicated  EVALUATION COMPLEXITY: Low   GOALS: Goals reviewed with patient? Yes  SHORT TERM GOALS: (target date for Short term goals are 3 weeks 05/22/2024)   1.  Patient will demonstrate independent use of home exercise program to maintain progress from in clinic treatments.  Goal status: Met in review 05/23/2024  LONG TERM GOALS: (target dates for all long term goals are 10 weeks  07/10/2024 )   1. Patient will demonstrate/report pain at worst less than or equal to 2/10 to facilitate minimal limitation in daily activity secondary to pain symptoms.  Goal status: on going 05/23/2024   2. Patient will demonstrate independent use of home exercise program to facilitate ability to maintain/progress functional gains from skilled physical therapy services.  Goal status: on going 05/23/2024   3. Patient will demonstrate Patient specific functional scale avg > or = 8/10 to indicate reduced disability due to condition.   Goal status: on going 05/23/2024   4.  Patient will demonstrate Lt  LE MMT 5/5 throughout to faciltiate usual transfers, stairs, squatting at [facility] for daily life.   Goal status: on going 05/23/2024   5.  Patient will demonstrate Lt ankle AROM equal to Rt to facilitate mobility for ambulation, daily activity at PLOF.  Goal status: on going 05/23/2024   6. Patient will demonstrate ascending/descending stairs reciprocally s UE assist for community integration.   Goal status: on  going 05/23/2024   7.  Patient will report blase return to work.  Goal Status: on going 05/23/2024  8. Patient will demonstrate independent ambulation s deviation for normal recreational walking, work walking.  Goal status: on going 05/23/2024   PLAN:  PT FREQUENCY: 1-2x/week  PT DURATION: 10 weeks  PLANNED INTERVENTIONS: Can include 02853- PT Re-evaluation, 97110-Therapeutic exercises, 97530- Therapeutic activity, 97112- Neuromuscular re-education, 97535- Self Care, 97140- Manual therapy, [phone]- Gait training, [phone]- Orthotic Fit/training, [phone]- Canalith repositioning, [id number]- Aquatic Therapy, [phone]- Electrical stimulation (unattended), [id number] Physical performance testing, 97016- Vasopneumatic device, [id number]- Ultrasound, [id number]- Traction (mechanical), [id number]- Ionotophoresis 4mg /ml Dexamethasone,  20560 - Needle insertion w/o injection 1 or 2 muscles, 20561 - Needle insertion w/o injection 3 or more muscles.   Patient/Family education, Balance training, Stair training, Taping, Dry Needling, Joint mobilization, Joint manipulation, Spinal manipulation, Spinal mobilization, Scar mobilization, Vestibular training, Visual/preceptual remediation/compensation, DME instructions, Cryotherapy, and Moist heat.  All performed as medically necessary.  All included unless contraindicated  PLAN FOR NEXT SESSION: Continued balance challenges, strengthening in functional movements.    Ozell Silvan, PT, DPT, OCS, ATC 06/05/2024  11:40 AM      [employer] Auth needed.     "

## 2024-06-12 ENCOUNTER — Encounter: Admitting: Rehabilitative and Restorative Service Providers"

## 2024-06-16 ENCOUNTER — Ambulatory Visit: Admitting: Rehabilitative and Restorative Service Providers"

## 2024-06-16 ENCOUNTER — Encounter: Payer: Self-pay | Admitting: Rehabilitative and Restorative Service Providers"

## 2024-06-16 DIAGNOSIS — M25572 Pain in left ankle and joints of left foot: Secondary | ICD-10-CM

## 2024-06-16 DIAGNOSIS — M6281 Muscle weakness (generalized): Secondary | ICD-10-CM

## 2024-06-16 DIAGNOSIS — R262 Difficulty in walking, not elsewhere classified: Secondary | ICD-10-CM | POA: Diagnosis not present

## 2024-06-16 DIAGNOSIS — M79605 Pain in left leg: Secondary | ICD-10-CM | POA: Diagnosis not present

## 2024-06-16 NOTE — Therapy (Signed)
 " OUTPATIENT PHYSICAL THERAPY TREATMENT   Patient Name: Kari Pineda MRN: [id number] DOB:[birth year], 64 y.o., female Today's Date: 06/16/2024  END OF SESSION:  PT End of Session - 06/16/24 1015     Visit Number 6    Number of Visits 20    Date for Recertification  07/10/24    Authorization Type [employer] auth needed,   no copay    Authorization Time Period -07/10/2024    Authorization - Number of Visits 20    Progress Note Due on Visit 10    PT Start Time 1011    PT Stop Time 1051    PT Time Calculation (min) 40 min    Activity Tolerance Patient tolerated treatment well    Behavior During Therapy [facility] for tasks assessed/performed               History reviewed. No pertinent past medical history. Past Surgical History:  Procedure Laterality Date   APPENDECTOMY     CHOLECYSTECTOMY     TONSILLECTOMY     WRIST SURGERY     There are no active problems to display for this patient.   PCP: Burney Pao MD  REFERRING PROVIDER: Gerome Maurilio HERO, PA-C  REFERRING DIAG: [phone] (ICD-10-CM) - Impingement of anterior part of ankle M77.9 (ICD-10-CM) - Tendonitis  THERAPY DIAG:  Pain in left ankle and joints of left foot  Pain in left leg  Muscle weakness (generalized)  Difficulty in walking, not elsewhere classified  Rationale for Evaluation and Treatment: Rehabilitation  ONSET DATE:  02/11/2024  SUBJECTIVE:   SUBJECTIVE STATEMENT: Pt indicated no pain upon arrival.  Pt indicated feeling some pulling from foot into hip/back.  Pt indicated some fatigue noted and giving way at times.  Reported trying to hike but limited in 15 mins .  PERTINENT HISTORY:   PAIN:  NPRS scale:0/10 Pain location: Lt ankle anteriorly.  Can shoot up leg.  Pain description: sharp, shooting.  Aggravating factors: prolonged WB activity, walking, standing.  Relieving factors: rest, inversion.   PRECAUTIONS: None  WEIGHT BEARING RESTRICTIONS: No  FALLS:  Has patient fallen in last  6 months? No  LIVING ENVIRONMENT: Lives in: House/apartment Stairs: flight of stairs to bedroom.   No stairs in garage.  Front porch with 3 stairs with bilateral rails.    OCCUPATION: [employer] (unable to work since injury.   PLOF: Independent, hiking, play softball/basketball.   PATIENT GOALS: Reduce pain, get back to activities.  Walk normal.  OBJECTIVE:   DIAGNOSTIC FINDINGS:  X rays 03/06/2024 B knee with medial joint line narrowing Ankle no fracture noted, mortis intact  PATIENT SURVEYS:  Patient-Specific Activity Scoring Scheme  0 represents unable to perform. 10 represents able to perform at prior level. 0 1 2 3 4 5 6 7 8 9  10 (Date and Score)   Activity Eval  05/01/2024  05/30/2024  1. Hike 3   6  2. Walk outside  3  10  3. Driving 4 7  4. Clean house 4 10  5. work 2 6  Score 3.2 7.8   Total score = sum of the activity scores/number of activities Minimum detectable change (90%CI) for average score = 2 points Minimum detectable change (90%CI) for single activity score = 3 points  COGNITION: 05/01/2024 Overall cognitive status: WFL    SENSATION: 05/01/2024 Not specific tested for dermatomes.   EDEMA:  05/01/2024 Figure 8 Lt ankle: 49 cm Figure 8 Rt ankle: 49 cms  MUSCLE LENGTH: 05/01/2024 No specific testing  today.   POSTURE:  05/01/2024 Deviation off Lt leg in standing posture.   PALPATION: 05/01/2024 Tenderness anterior joint line Lt talocrural joint, lateral joint line inferior to lateral malleolus.   LOWER EXTREMITY MMT:    Right Eval 05/01/2024 Left Eval 05/01/2024 Left 05/30/2024 Left 06/16/2024  Hip flexion      Hip extension      Hip abduction      Hip adduction      Hip internal rotation      Hip external rotation      Knee flexion 5/5 4/5 5/5   Knee extension 5/5 4/5 5/5   Ankle dorsiflexion 5/5 3+/5 4+/5   Ankle plantarflexion  2+/5  3+/5 (2-3 reps in SL heel raise)  Ankle inversion 5/5 3+/5 5/5   Ankle  eversion 5/5 3+/5 4/5    (Blank rows = not tested)  LOWER EXTREMITY ROM:   Right Eval 05/01/2024 Left Eval 05/01/2024 Left 05/23/2024 AROM Left 06/05/2024  Hip flexion      Hip extension      Hip abduction      Hip adduction      Hip internal rotation      Hip external rotation      Knee flexion      Knee extension      Ankle dorsiflexion 13 AROM in 90 deg knee flexion -9 AROM in 90 deg in knee flexion With pain  -11 AROM in 0 deg knee extension    0 deg in 0 deg knee extension long sitting 6 deg in 90 deg knee flexion   6 deg in 0 deg knee flexion   Ankle plantarflexion      Ankle inversion 32 22 c pain 35 40  Ankle eversion 32 12 c pain 30 40   (Blank rows = not tested)  LOWER EXTREMITY SPECIAL TESTS:  05/01/2024 No specific testing performed.   FUNCTIONAL TESTS:  06/16/2024: Lt leg SLS:   05/01/2024 18 inch chair transfer: able on 1st try with weight deviation to Rt due to pain.  Lt SLS no shoes:  unable unassisted, pain noted.  Rt SLS  no shoes: 30 seconds   GAIT: 05/01/2024 Independent ambulation c deviation in stance on Lt leg, lacking toe off progression due to antalgic gait, DF restriction.                                                                                                                                                                          TODAY'S TREATMENT  DATE: 06/16/2024 Therex: Nustep lvl 6 10 mins for Rom, endurance.  UE/LE use Standing PF with feet on incline board with majority of weight lowering eccentrically on Lt 2 x 10 (calf stretching after each rep) Incline gastroc stretch bilateral 30 sec x 5  Long sitting blue band inversion, eversion 2 x 15 each .    Neuro Re-ed (muscle activation, coordination) Tandem stance 1 min x 1 bilaterally Tandem ambulation on foam in // bars with occasional HHA 6 ft x 6 each way with slow control focus.  SLS on foam  with contralateral leg over and back tap 9 inch hurdle (ends up being a little less due to foam height)  x 15 bilaterally  Alternating heel/toe lift bilaterally on foam with minimal HHA on bar x 20 each way   TODAY'S TREATMENT                                                                          DATE: 06/05/2024 Therex: Nustep Lvl 6 10 mins UE/LE for ROM, endurance Incilne gastroc stretch 30sec x 5 bilateral BAPS seated Lt leg lvl 3 fwd/back x 20 light touching, side to side x 20 each, circles cw x 15, ccw x 15    Neuro Re-ed (muscle activation, coordination) Tandem stance on foam in // bars with occasional HHA 1 min x 2 bilaterally  Tandem ambulation on foam fwd/back with occasional HHA 6 ft x 6 each way Feet together stance across foam bar 1 min     TODAY'S TREATMENT                                                                          DATE: 05/30/2024 Therex: Nustep Lvl 6 10 mins UE/LE for Rom, endurance.  Incilne gastroc stretch 30 sec x 5 bilaterally performed DF stretch on incline board with lunge x 10  BAPS seated Lt leg lvl 2 fwd/back x 20 light touching, side to side x 20 each, circles cw x 15, ccw x 15    Neuro Re-ed (muscle activation, coordination) Tandem stance on ground with occasional HHA on bar, SBA 1 min x 1 bilaterally, on foam 1 min x 1 bilaterally with mild increase in HHA Light hand assist on bar alternating PF/DF 2 x 15    PATIENT EDUCATION:  06/16/2024  Education details: HEP update Person educated: Patient Education method: Programmer, Multimedia, Demonstration, Verbal cues, and Handouts Education comprehension: verbalized understanding, returned demonstration, and verbal cues required  HOME EXERCISE PROGRAM: Access Code: [email or web address] URL: [email or web address] Date: 06/16/2024 Prepared by: Ozell Silvan  Exercises - Long Sitting Calf Stretch with Strap  - 2-3 x daily - 7 x weekly - 1 sets - 3-5 reps - 30 hold - Long Sitting Ankle Eversion with  Resistance  - 2 x daily - 7 x weekly - 3 sets - 10 reps - Long Sitting Ankle Inversion with Resistance  - 2 x daily - 7 x weekly - 3 sets -  10 reps - Long Sitting Ankle Dorsiflexion with Anchored Resistance (Mirrored)  - 1-2 x daily - 7 x weekly - 2-3 sets - 10-15 reps - [place]  - 1-2 x daily - 7 x weekly - 1 sets - 1-2 mins hold - Tandem Stance in Corner  - 1-2 x daily - 7 x weekly - 1 sets - 3-5 reps - 30 hold - Heel Raises with Counter Support  - 1 x daily - 7 x weekly - 2-3 sets - 10 reps - Standing Gastroc Stretch on Step  - 2-3 x daily - 7 x weekly - 1 sets - 5 reps - 30 sec hold  ASSESSMENT:  CLINICAL IMPRESSION: Gains have been noted with ability to perform single leg PF for first time today.  Continued movement coordination deficits noted and DF restriction in mobility of daily activity.  OBJECTIVE IMPAIRMENTS: Abnormal gait, decreased activity tolerance, decreased balance, decreased coordination, decreased endurance, decreased mobility, difficulty walking, decreased ROM, decreased strength, increased fascial restrictions, impaired perceived functional ability, impaired flexibility, improper body mechanics, and pain.   ACTIVITY LIMITATIONS: lifting, bending, sitting, standing, squatting, sleeping, stairs, transfers, bed mobility, and locomotion level  PARTICIPATION LIMITATIONS: meal prep, cleaning, laundry, interpersonal relationship, driving, shopping, community activity, and occupation  PERSONAL FACTORS: No specific factors are also affecting patient's functional outcome.   REHAB POTENTIAL: Good  CLINICAL DECISION MAKING: Stable/uncomplicated  EVALUATION COMPLEXITY: Low   GOALS: Goals reviewed with patient? Yes  SHORT TERM GOALS: (target date for Short term goals are 3 weeks 05/22/2024)   1.  Patient will demonstrate independent use of home exercise program to maintain progress from in clinic treatments.  Goal status: Met in review 05/23/2024  LONG TERM GOALS:  (target dates for all long term goals are 10 weeks  07/10/2024 )   1. Patient will demonstrate/report pain at worst less than or equal to 2/10 to facilitate minimal limitation in daily activity secondary to pain symptoms.  Goal status: on going 05/23/2024   2. Patient will demonstrate independent use of home exercise program to facilitate ability to maintain/progress functional gains from skilled physical therapy services.  Goal status: on going 05/23/2024   3. Patient will demonstrate Patient specific functional scale avg > or = 8/10 to indicate reduced disability due to condition.   Goal status: on going 05/23/2024   4.  Patient will demonstrate Lt  LE MMT 5/5 throughout to faciltiate usual transfers, stairs, squatting at [facility] for daily life.   Goal status: on going 05/23/2024   5.  Patient will demonstrate Lt ankle AROM equal to Rt to facilitate mobility for ambulation, daily activity at PLOF.  Goal status: on going 05/23/2024   6. Patient will demonstrate ascending/descending stairs reciprocally s UE assist for community integration.   Goal status: on going 05/23/2024   7.  Patient will report blase return to work.  Goal Status: on going 05/23/2024  8. Patient will demonstrate independent ambulation s deviation for normal recreational walking, work walking.  Goal status: on going 05/23/2024   PLAN:  PT FREQUENCY: 1-2x/week  PT DURATION: 10 weeks  PLANNED INTERVENTIONS: Can include 02853- PT Re-evaluation, 97110-Therapeutic exercises, 97530- Therapeutic activity, 97112- Neuromuscular re-education, 97535- Self Care, 97140- Manual therapy, [phone]- Gait training, [phone]- Orthotic Fit/training, [phone]- Canalith repositioning, [id number]- Aquatic Therapy, [phone]- Electrical stimulation (unattended), [id number] Physical performance testing, 97016- Vasopneumatic device, [id number]- Ultrasound, [id number]- Traction (mechanical), [id number]- Ionotophoresis 4mg /ml Dexamethasone,  [id number] - Needle insertion w/o  injection 1 or 2 muscles, 79438 - Needle  insertion w/o injection 3 or more muscles.   Patient/Family education, Balance training, Stair training, Taping, Dry Needling, Joint mobilization, Joint manipulation, Spinal manipulation, Spinal mobilization, Scar mobilization, Vestibular training, Visual/preceptual remediation/compensation, DME instructions, Cryotherapy, and Moist heat.  All performed as medically necessary.  All included unless contraindicated  PLAN FOR NEXT SESSION: Continued balance challenges, strengthening in functional movements.      Ozell Silvan, PT, DPT, OCS, ATC 06/16/2024  10:51 AM   [employer] Auth needed.     "

## 2024-06-20 ENCOUNTER — Ambulatory Visit (INDEPENDENT_AMBULATORY_CARE_PROVIDER_SITE_OTHER): Admitting: Rehabilitative and Restorative Service Providers"

## 2024-06-20 ENCOUNTER — Encounter: Payer: Self-pay | Admitting: Rehabilitative and Restorative Service Providers"

## 2024-06-20 DIAGNOSIS — M6281 Muscle weakness (generalized): Secondary | ICD-10-CM | POA: Diagnosis not present

## 2024-06-20 DIAGNOSIS — R262 Difficulty in walking, not elsewhere classified: Secondary | ICD-10-CM | POA: Diagnosis not present

## 2024-06-20 DIAGNOSIS — M79605 Pain in left leg: Secondary | ICD-10-CM

## 2024-06-20 DIAGNOSIS — M25572 Pain in left ankle and joints of left foot: Secondary | ICD-10-CM

## 2024-06-20 NOTE — Therapy (Signed)
 " OUTPATIENT PHYSICAL THERAPY TREATMENT   Patient Name: Kari Pineda MRN: [id number] DOB:[birth year], 64 y.o., female Today's Date: 06/20/2024  END OF SESSION:  PT End of Session - 06/20/24 1059     Visit Number 7    Number of Visits 20    Date for Recertification  07/10/24    Authorization Type [employer] auth needed,   no copay    Authorization Time Period -07/10/2024    Authorization - Visit Number 7    Authorization - Number of Visits 20    Progress Note Due on Visit 10    PT Start Time 1055    PT Stop Time 1134    PT Time Calculation (min) 39 min    Activity Tolerance Patient tolerated treatment well    Behavior During Therapy [place] for tasks assessed/performed                History reviewed. No pertinent past medical history. Past Surgical History:  Procedure Laterality Date   APPENDECTOMY     CHOLECYSTECTOMY     TONSILLECTOMY     WRIST SURGERY     There are no active problems to display for this patient.   PCP: Burney Pao MD  REFERRING PROVIDER: Gerome Maurilio HERO, PA-C  REFERRING DIAG: [phone] (ICD-10-CM) - Impingement of anterior part of ankle M77.9 (ICD-10-CM) - Tendonitis  THERAPY DIAG:  Pain in left ankle and joints of left foot  Pain in left leg  Muscle weakness (generalized)  Difficulty in walking, not elsewhere classified  Rationale for Evaluation and Treatment: Rehabilitation  ONSET DATE:  02/11/2024  SUBJECTIVE:   SUBJECTIVE STATEMENT: Pt indicated feeling ankle sore/tight after visit but better icing helped that night.  Reported a little swelling at night with elevation helping as well.  No pain upon arrival.   PERTINENT HISTORY:   PAIN:  NPRS scale:0/10 Pain location: Lt ankle anteriorly.  Can shoot up leg.  Pain description: sharp, shooting.  Aggravating factors: prolonged WB activity, walking, standing.  Relieving factors: rest, inversion.   PRECAUTIONS: None  WEIGHT BEARING RESTRICTIONS: No  FALLS:  Has patient  fallen in last 6 months? No  LIVING ENVIRONMENT: Lives in: House/apartment Stairs: flight of stairs to bedroom.   No stairs in garage.  Front porch with 3 stairs with bilateral rails.    OCCUPATION: [employer] (unable to work since injury.   PLOF: Independent, hiking, play softball/basketball.   PATIENT GOALS: Reduce pain, get back to activities.  Walk normal.  OBJECTIVE:   DIAGNOSTIC FINDINGS:  X rays 03/06/2024 B knee with medial joint line narrowing Ankle no fracture noted, mortis intact  PATIENT SURVEYS:  Patient-Specific Activity Scoring Scheme  0 represents unable to perform. 10 represents able to perform at prior level. 0 1 2 3 4 5 6 7 8 9  10 (Date and Score)   Activity Eval  05/01/2024  05/30/2024  1. Hike 3   6  2. Walk outside  3  10  3. Driving 4 7  4. Clean house 4 10  5. work 2 6  Score 3.2 7.8   Total score = sum of the activity scores/number of activities Minimum detectable change (90%CI) for average score = 2 points Minimum detectable change (90%CI) for single activity score = 3 points  COGNITION: 05/01/2024 Overall cognitive status: WFL    SENSATION: 05/01/2024 Not specific tested for dermatomes.   EDEMA:  05/01/2024 Figure 8 Lt ankle: 49 cm Figure 8 Rt ankle: 49 cms  MUSCLE LENGTH: 05/01/2024 No  specific testing today.   POSTURE:  05/01/2024 Deviation off Lt leg in standing posture.   PALPATION: 05/01/2024 Tenderness anterior joint line Lt talocrural joint, lateral joint line inferior to lateral malleolus.   LOWER EXTREMITY MMT:    Right Eval 05/01/2024 Left Eval 05/01/2024 Left 05/30/2024 Left 06/16/2024  Hip flexion      Hip extension      Hip abduction      Hip adduction      Hip internal rotation      Hip external rotation      Knee flexion 5/5 4/5 5/5   Knee extension 5/5 4/5 5/5   Ankle dorsiflexion 5/5 3+/5 4+/5   Ankle plantarflexion  2+/5  3+/5 (2-3 reps in SL heel raise)  Ankle inversion 5/5 3+/5  5/5   Ankle eversion 5/5 3+/5 4/5    (Blank rows = not tested)  LOWER EXTREMITY ROM:   Right Eval 05/01/2024 Left Eval 05/01/2024 Left 05/23/2024 AROM Left 06/05/2024  Hip flexion      Hip extension      Hip abduction      Hip adduction      Hip internal rotation      Hip external rotation      Knee flexion      Knee extension      Ankle dorsiflexion 13 AROM in 90 deg knee flexion -9 AROM in 90 deg in knee flexion With pain  -11 AROM in 0 deg knee extension    0 deg in 0 deg knee extension long sitting 6 deg in 90 deg knee flexion   6 deg in 0 deg knee flexion   Ankle plantarflexion      Ankle inversion 32 22 c pain 35 40  Ankle eversion 32 12 c pain 30 40   (Blank rows = not tested)  LOWER EXTREMITY SPECIAL TESTS:  05/01/2024 No specific testing performed.   FUNCTIONAL TESTS:  06/20/2024: Lt leg SLS: 3-5 seconds on attempt   05/01/2024 18 inch chair transfer: able on 1st try with weight deviation to Rt due to pain.  Lt SLS no shoes:  unable unassisted, pain noted.  Rt SLS  no shoes: 30 seconds   GAIT: 05/01/2024 Independent ambulation c deviation in stance on Lt leg, lacking toe off progression due to antalgic gait, DF restriction.                                                                                                                                                                          TODAY'S TREATMENT  DATE: 06/20/2024 Therex: Nustep lvl 6 10 mins for ROM, endurance.  UE /LE use.  Focus on keeping heel down.  Stnading PF double leg up, majority on Lt coming down 2 x 10 with hands on bar Step on over and down 4 inch step WB on Lt leg x 10  Incline gastroc stretch 30 sec x 5 in runner position with Lt leg lower  Neuro Re-ed (muscle activation, coordination) Fitter rocker board fwd/back light touching bilateral LE x 25 touches each way Fitter rocker board hoirzontal balance  attempts for 1 min fwd/back bilateral LE SLS with contralateral leg tapping corners of foam x 8 each, performed bilaterally  Step to pattern over 9 inch hurdle forward 10 ft x 5 each leg leading.  Lateral step to pattern 9 inch hurdle 10 ft x 5 each way    TODAY'S TREATMENT                                                                          DATE: 06/16/2024 Therex: Nustep lvl 6 10 mins for Rom, endurance.  UE/LE use Standing PF with feet on incline board with majority of weight lowering eccentrically on Lt 2 x 10 (calf stretching after each rep) Incline gastroc stretch bilateral 30 sec x 5  Long sitting blue band inversion, eversion 2 x 15 each .    Neuro Re-ed (muscle activation, coordination) Tandem stance 1 min x 1 bilaterally Tandem ambulation on foam in // bars with occasional HHA 6 ft x 6 each way with slow control focus.  SLS on foam with contralateral leg over and back tap 9 inch hurdle (ends up being a little less due to foam height)  x 15 bilaterally  Alternating heel/toe lift bilaterally on foam with minimal HHA on bar x 20 each way   TODAY'S TREATMENT                                                                          DATE: 06/05/2024 Therex: Nustep Lvl 6 10 mins UE/LE for ROM, endurance Incilne gastroc stretch 30sec x 5 bilateral BAPS seated Lt leg lvl 3 fwd/back x 20 light touching, side to side x 20 each, circles cw x 15, ccw x 15    Neuro Re-ed (muscle activation, coordination) Tandem stance on foam in // bars with occasional HHA 1 min x 2 bilaterally  Tandem ambulation on foam fwd/back with occasional HHA 6 ft x 6 each way Feet together stance across foam bar 1 min     PATIENT EDUCATION:  06/16/2024  Education details: HEP update Person educated: Patient Education method: Programmer, Multimedia, Demonstration, Verbal cues, and Handouts Education comprehension: verbalized understanding, returned demonstration, and verbal cues required  HOME EXERCISE  PROGRAM: Access Code: [email or web address] URL: [email or web address] Date: 06/16/2024 Prepared by: Ozell Silvan  Exercises - Long Sitting Calf Stretch with Strap  - 2-3 x daily - 7 x weekly - 1 sets - 3-5 reps - 30 hold -  Long Sitting Ankle Eversion with Resistance  - 2 x daily - 7 x weekly - 3 sets - 10 reps - Long Sitting Ankle Inversion with Resistance  - 2 x daily - 7 x weekly - 3 sets - 10 reps - Long Sitting Ankle Dorsiflexion with Anchored Resistance (Mirrored)  - 1-2 x daily - 7 x weekly - 2-3 sets - 10-15 reps - [place]  - 1-2 x daily - 7 x weekly - 1 sets - 1-2 mins hold - Tandem Stance in Corner  - 1-2 x daily - 7 x weekly - 1 sets - 3-5 reps - 30 hold - Heel Raises with Counter Support  - 1 x daily - 7 x weekly - 2-3 sets - 10 reps - Standing Gastroc Stretch on Step  - 2-3 x daily - 7 x weekly - 1 sets - 5 reps - 30 sec hold  ASSESSMENT:  CLINICAL IMPRESSION: Dynamic stability still lacking in Lt ankle control, specifically on compliant surfaces.   OBJECTIVE IMPAIRMENTS: Abnormal gait, decreased activity tolerance, decreased balance, decreased coordination, decreased endurance, decreased mobility, difficulty walking, decreased ROM, decreased strength, increased fascial restrictions, impaired perceived functional ability, impaired flexibility, improper body mechanics, and pain.   ACTIVITY LIMITATIONS: lifting, bending, sitting, standing, squatting, sleeping, stairs, transfers, bed mobility, and locomotion level  PARTICIPATION LIMITATIONS: meal prep, cleaning, laundry, interpersonal relationship, driving, shopping, community activity, and occupation  PERSONAL FACTORS: No specific factors are also affecting patient's functional outcome.   REHAB POTENTIAL: Good  CLINICAL DECISION MAKING: Stable/uncomplicated  EVALUATION COMPLEXITY: Low   GOALS: Goals reviewed with patient? Yes  SHORT TERM GOALS: (target date for Short term goals are 3 weeks 05/22/2024)   1.   Patient will demonstrate independent use of home exercise program to maintain progress from in clinic treatments.  Goal status: Met in review 05/23/2024  LONG TERM GOALS: (target dates for all long term goals are 10 weeks  07/10/2024 )   1. Patient will demonstrate/report pain at worst less than or equal to 2/10 to facilitate minimal limitation in daily activity secondary to pain symptoms.  Goal status: on going 05/23/2024   2. Patient will demonstrate independent use of home exercise program to facilitate ability to maintain/progress functional gains from skilled physical therapy services.  Goal status: on going 05/23/2024   3. Patient will demonstrate Patient specific functional scale avg > or = 8/10 to indicate reduced disability due to condition.   Goal status: on going 05/23/2024   4.  Patient will demonstrate Lt  LE MMT 5/5 throughout to faciltiate usual transfers, stairs, squatting at [facility] for daily life.   Goal status: on going 05/23/2024   5.  Patient will demonstrate Lt ankle AROM equal to Rt to facilitate mobility for ambulation, daily activity at PLOF.  Goal status: on going 05/23/2024   6. Patient will demonstrate ascending/descending stairs reciprocally s UE assist for community integration.   Goal status: on going 05/23/2024   7.  Patient will report blase return to work.  Goal Status: on going 05/23/2024  8. Patient will demonstrate independent ambulation s deviation for normal recreational walking, work walking.  Goal status: on going 05/23/2024   PLAN:  PT FREQUENCY: 1-2x/week  PT DURATION: 10 weeks  PLANNED INTERVENTIONS: Can include 02853- PT Re-evaluation, 97110-Therapeutic exercises, 97530- Therapeutic activity, [id number]- Neuromuscular re-education, 97535- Self Care, 97140- Manual therapy, [id number]- Gait training, [phone]- Orthotic Fit/training, [phone]- Canalith repositioning, [id number]- Aquatic Therapy, H9716- Electrical stimulation (unattended), [id number] Physical  performance testing, 97016-  Vasopneumatic device, [id number]- Ultrasound, 02987- Traction (mechanical), [id number]- Ionotophoresis 4mg /ml Dexamethasone,  20560 - Needle insertion w/o injection 1 or 2 muscles, 20561 - Needle insertion w/o injection 3 or more muscles.   Patient/Family education, Balance training, Stair training, Taping, Dry Needling, Joint mobilization, Joint manipulation, Spinal manipulation, Spinal mobilization, Scar mobilization, Vestibular training, Visual/preceptual remediation/compensation, DME instructions, Cryotherapy, and Moist heat.  All performed as medically necessary.  All included unless contraindicated  PLAN FOR NEXT SESSION: Continued balance challenges.   Ozell Silvan, PT, DPT, OCS, ATC 06/20/2024  11:31 AM     [employer] Auth needed.     "

## 2024-06-23 ENCOUNTER — Encounter: Payer: Self-pay | Admitting: Rehabilitative and Restorative Service Providers"

## 2024-06-23 ENCOUNTER — Ambulatory Visit: Admitting: Rehabilitative and Restorative Service Providers"

## 2024-06-23 DIAGNOSIS — R262 Difficulty in walking, not elsewhere classified: Secondary | ICD-10-CM | POA: Diagnosis not present

## 2024-06-23 DIAGNOSIS — M6281 Muscle weakness (generalized): Secondary | ICD-10-CM

## 2024-06-23 DIAGNOSIS — M25572 Pain in left ankle and joints of left foot: Secondary | ICD-10-CM

## 2024-06-23 DIAGNOSIS — M79605 Pain in left leg: Secondary | ICD-10-CM

## 2024-06-23 NOTE — Therapy (Signed)
 " OUTPATIENT PHYSICAL THERAPY TREATMENT   Patient Name: Kari Pineda MRN: [id number] DOB:[birth year], 64 y.o., female Today's Date: 06/23/2024  END OF SESSION:  PT End of Session - 06/23/24 1013     Visit Number 8    Number of Visits 20    Date for Recertification  07/10/24    Authorization Type [employer] auth needed,   no copay    Authorization Time Period -07/10/2024    Authorization - Number of Visits 20    Progress Note Due on Visit 10    PT Start Time 1007    PT Stop Time 1046    PT Time Calculation (min) 39 min    Activity Tolerance Patient tolerated treatment well    Behavior During Therapy [facility] for tasks assessed/performed                 History reviewed. No pertinent past medical history. Past Surgical History:  Procedure Laterality Date   APPENDECTOMY     CHOLECYSTECTOMY     TONSILLECTOMY     WRIST SURGERY     There are no active problems to display for this patient.   PCP: Burney Pao MD  REFERRING PROVIDER: Gerome Maurilio HERO, PA-C  REFERRING DIAG: [phone] (ICD-10-CM) - Impingement of anterior part of ankle M77.9 (ICD-10-CM) - Tendonitis  THERAPY DIAG:  Pain in left ankle and joints of left foot  Pain in left leg  Muscle weakness (generalized)  Difficulty in walking, not elsewhere classified  Rationale for Evaluation and Treatment: Rehabilitation  ONSET DATE:  02/11/2024  SUBJECTIVE:   SUBJECTIVE STATEMENT: Pt indicated a twist on the stairs with some swelling response but has recovered.  Pt indicated it was a quick pain.  Pt also indicated down the back of Lt thigh, knee tightness complaints with attempts on bike and initially after.   PERTINENT HISTORY:   PAIN:  NPRS scale:0/10 Pain location: Lt ankle anteriorly.  Can shoot up leg.  Pain description: sharp, shooting.  Aggravating factors: prolonged WB activity, walking, standing.  Relieving factors: rest, inversion.   PRECAUTIONS: None  WEIGHT BEARING RESTRICTIONS:  No  FALLS:  Has patient fallen in last 6 months? No  LIVING ENVIRONMENT: Lives in: House/apartment Stairs: flight of stairs to bedroom.   No stairs in garage.  Front porch with 3 stairs with bilateral rails.    OCCUPATION: [employer] (unable to work since injury.   PLOF: Independent, hiking, play softball/basketball.   PATIENT GOALS: Reduce pain, get back to activities.  Walk normal.  OBJECTIVE:   DIAGNOSTIC FINDINGS:  X rays 03/06/2024 B knee with medial joint line narrowing Ankle no fracture noted, mortis intact  PATIENT SURVEYS:  Patient-Specific Activity Scoring Scheme  0 represents unable to perform. 10 represents able to perform at prior level. 0 1 2 3 4 5 6 7 8 9  10 (Date and Score)   Activity Eval  05/01/2024  05/30/2024  1. Hike 3   6  2. Walk outside  3  10  3. Driving 4 7  4. Clean house 4 10  5. work 2 6  Score 3.2 7.8   Total score = sum of the activity scores/number of activities Minimum detectable change (90%CI) for average score = 2 points Minimum detectable change (90%CI) for single activity score = 3 points  COGNITION: 05/01/2024 Overall cognitive status: WFL    SENSATION: 05/01/2024 Not specific tested for dermatomes.   EDEMA:  05/01/2024 Figure 8 Lt ankle: 49 cm Figure 8 Rt ankle: 49 cms  MUSCLE LENGTH: 05/01/2024 No specific testing today.   POSTURE:  05/01/2024 Deviation off Lt leg in standing posture.   PALPATION: 05/01/2024 Tenderness anterior joint line Lt talocrural joint, lateral joint line inferior to lateral malleolus.   LOWER EXTREMITY MMT:    Right Eval 05/01/2024 Left Eval 05/01/2024 Left 05/30/2024 Left 06/16/2024  Hip flexion      Hip extension      Hip abduction      Hip adduction      Hip internal rotation      Hip external rotation      Knee flexion 5/5 4/5 5/5   Knee extension 5/5 4/5 5/5   Ankle dorsiflexion 5/5 3+/5 4+/5   Ankle plantarflexion  2+/5  3+/5 (2-3 reps in SL heel raise)   Ankle inversion 5/5 3+/5 5/5   Ankle eversion 5/5 3+/5 4/5    (Blank rows = not tested)  LOWER EXTREMITY ROM:   Right Eval 05/01/2024 Left Eval 05/01/2024 Left 05/23/2024 AROM Left 06/05/2024 Left 06/23/2024  Hip flexion       Hip extension       Hip abduction       Hip adduction       Hip internal rotation       Hip external rotation       Knee flexion       Knee extension       Ankle dorsiflexion 13 AROM in 90 deg knee flexion -9 AROM in 90 deg in knee flexion With pain  -11 AROM in 0 deg knee extension    0 deg in 0 deg knee extension long sitting 6 deg in 90 deg knee flexion   6 deg in 0 deg knee flexion  10 deg in 90 deg knee flexion    8 deg in 0 deg knee flexion   Ankle plantarflexion       Ankle inversion 32 22 c pain 35 40   Ankle eversion 32 12 c pain 30 40    (Blank rows = not tested)  LOWER EXTREMITY SPECIAL TESTS:  05/01/2024 No specific testing performed.   FUNCTIONAL TESTS:  06/20/2024: Lt leg SLS: 3-5 seconds on attempt   05/01/2024 18 inch chair transfer: able on 1st try with weight deviation to Rt due to pain.  Lt SLS no shoes:  unable unassisted, pain noted.  Rt SLS  no shoes: 30 seconds   GAIT: 05/01/2024 Independent ambulation c deviation in stance on Lt leg, lacking toe off progression due to antalgic gait, DF restriction.  TODAY'S TREATMENT                                                                          DATE: 06/23/2024 Therex: Recumbent bike seat 4 for ROM.  Stopped at 5 mins due to knee tightness/pulling complaints Seated Lt hamstring stretch 30 sec x 3 to tolerance.  (Helped improve symptoms)  Seated legs outstretched green band around midfoot eversion x 15 with slow movement focus Lateral stepping c green band around midfoot with eversion focus 15 ft x 3  each way  Incline stretch 30 sec alternating with double leg PF with slow lowering focus    Neuro Re-ed (muscle activation, coordination) SLS on foam with occasionally HHA on bar 30 sec x 3 alternating bilaterally  Fitter rocker board fwd/back light touching focus bilateral LE x 25 each way Forward step up on 6 inch step with airex foam on top with single leg balance hold 1-2 seconds with slow reverse step down - x 10 bilaterally with occasional to moderate HHA     TODAY'S TREATMENT                                                                          DATE: 06/20/2024 Therex: Nustep lvl 6 10 mins for ROM, endurance.  UE /LE use.  Focus on keeping heel down.  Stnading PF double leg up, majority on Lt coming down 2 x 10 with hands on bar Step on over and down 4 inch step WB on Lt leg x 10  Incline gastroc stretch 30 sec x 5 in runner position with Lt leg lower  Neuro Re-ed (muscle activation, coordination) Fitter rocker board fwd/back light touching bilateral LE x 25 touches each way Fitter rocker board hoirzontal balance attempts for 1 min fwd/back bilateral LE SLS with contralateral leg tapping corners of foam x 8 each, performed bilaterally  Step to pattern over 9 inch hurdle forward 10 ft x 5 each leg leading.  Lateral step to pattern 9 inch hurdle 10 ft x 5 each way    TODAY'S TREATMENT                                                                          DATE: 06/16/2024 Therex: Nustep lvl 6 10 mins for Rom, endurance.  UE/LE use Standing PF with feet on incline board with majority of weight lowering eccentrically on Lt 2 x 10 (calf stretching after each rep) Incline gastroc stretch bilateral 30 sec x 5  Long sitting blue band inversion, eversion 2 x 15 each .    Neuro Re-ed (muscle activation, coordination) Tandem stance 1 min x 1 bilaterally Tandem ambulation on foam in //  bars with occasional HHA 6 ft x 6 each way with slow control focus.  SLS on foam with contralateral  leg over and back tap 9 inch hurdle (ends up being a little less due to foam height)  x 15 bilaterally  Alternating heel/toe lift bilaterally on foam with minimal HHA on bar x 20 each way   TODAY'S TREATMENT                                                                          DATE: 06/05/2024 Therex: Nustep Lvl 6 10 mins UE/LE for ROM, endurance Incilne gastroc stretch 30sec x 5 bilateral BAPS seated Lt leg lvl 3 fwd/back x 20 light touching, side to side x 20 each, circles cw x 15, ccw x 15    Neuro Re-ed (muscle activation, coordination) Tandem stance on foam in // bars with occasional HHA 1 min x 2 bilaterally  Tandem ambulation on foam fwd/back with occasional HHA 6 ft x 6 each way Feet together stance across foam bar 1 min     PATIENT EDUCATION:  06/16/2024  Education details: HEP update Person educated: Patient Education method: Programmer, Multimedia, Demonstration, Verbal cues, and Handouts Education comprehension: verbalized understanding, returned demonstration, and verbal cues required  HOME EXERCISE PROGRAM: Access Code: [email or web address] URL: [email or web address] Date: 06/16/2024 Prepared by: Ozell Silvan  Exercises - Long Sitting Calf Stretch with Strap  - 2-3 x daily - 7 x weekly - 1 sets - 3-5 reps - 30 hold - Long Sitting Ankle Eversion with Resistance  - 2 x daily - 7 x weekly - 3 sets - 10 reps - Long Sitting Ankle Inversion with Resistance  - 2 x daily - 7 x weekly - 3 sets - 10 reps - Long Sitting Ankle Dorsiflexion with Anchored Resistance (Mirrored)  - 1-2 x daily - 7 x weekly - 2-3 sets - 10-15 reps - [place]  - 1-2 x daily - 7 x weekly - 1 sets - 1-2 mins hold - Tandem Stance in Corner  - 1-2 x daily - 7 x weekly - 1 sets - 3-5 reps - 30 hold - Heel Raises with Counter Support  - 1 x daily - 7 x weekly - 2-3 sets - 10 reps - Standing Gastroc Stretch on Step  - 2-3 x daily - 7 x weekly - 1 sets - 5 reps - 30 sec hold  ASSESSMENT:  CLINICAL  IMPRESSION: Pt will benefit from continued eversion strengthening and control in WB uneven surfaces to aid return to hiking activity.  Some lateral/posterior hip/leg aggravations that were improved with stretching today in clinic.   OBJECTIVE IMPAIRMENTS: Abnormal gait, decreased activity tolerance, decreased balance, decreased coordination, decreased endurance, decreased mobility, difficulty walking, decreased ROM, decreased strength, increased fascial restrictions, impaired perceived functional ability, impaired flexibility, improper body mechanics, and pain.   ACTIVITY LIMITATIONS: lifting, bending, sitting, standing, squatting, sleeping, stairs, transfers, bed mobility, and locomotion level  PARTICIPATION LIMITATIONS: meal prep, cleaning, laundry, interpersonal relationship, driving, shopping, community activity, and occupation  PERSONAL FACTORS: No specific factors are also affecting patient's functional outcome.   REHAB POTENTIAL: Good  CLINICAL DECISION MAKING: Stable/uncomplicated  EVALUATION COMPLEXITY: Low   GOALS: Goals reviewed with patient? Yes  SHORT  TERM GOALS: (target date for Short term goals are 3 weeks 05/22/2024)   1.  Patient will demonstrate independent use of home exercise program to maintain progress from in clinic treatments.  Goal status: Met in review 05/23/2024  LONG TERM GOALS: (target dates for all long term goals are 10 weeks  07/10/2024 )   1. Patient will demonstrate/report pain at worst less than or equal to 2/10 to facilitate minimal limitation in daily activity secondary to pain symptoms.  Goal status: on going 06/23/2024   2. Patient will demonstrate independent use of home exercise program to facilitate ability to maintain/progress functional gains from skilled physical therapy services.  Goal status: on going 06/23/2024   3. Patient will demonstrate Patient specific functional scale avg > or = 8/10 to indicate reduced disability due to condition.    Goal status: on going 06/23/2024   4.  Patient will demonstrate Lt  LE MMT 5/5 throughout to faciltiate usual transfers, stairs, squatting at [facility] for daily life.   Goal status: on going 06/23/2024   5.  Patient will demonstrate Lt ankle AROM equal to Rt to facilitate mobility for ambulation, daily activity at PLOF.  Goal status: on going 06/23/2024   6. Patient will demonstrate ascending/descending stairs reciprocally s UE assist for community integration.   Goal status: on going 06/23/2024   7.  Patient will report blase return to work.  Goal Status: on going 06/23/2024  8. Patient will demonstrate independent ambulation s deviation for normal recreational walking, work walking.  Goal status: on going 06/23/2024   PLAN:  PT FREQUENCY: 1-2x/week  PT DURATION: 10 weeks  PLANNED INTERVENTIONS: Can include 02853- PT Re-evaluation, 97110-Therapeutic exercises, 97530- Therapeutic activity, 97112- Neuromuscular re-education, 97535- Self Care, 97140- Manual therapy, [phone]- Gait training, [phone]- Orthotic Fit/training, [phone]- Canalith repositioning, [id number]- Aquatic Therapy, [phone]- Electrical stimulation (unattended), [id number] Physical performance testing, 97016- Vasopneumatic device, [id number]- Ultrasound, [id number]- Traction (mechanical), [id number]- Ionotophoresis 4mg /ml Dexamethasone,  20560 - Needle insertion w/o injection 1 or 2 muscles, 20561 - Needle insertion w/o injection 3 or more muscles.   Patient/Family education, Balance training, Stair training, Taping, Dry Needling, Joint mobilization, Joint manipulation, Spinal manipulation, Spinal mobilization, Scar mobilization, Vestibular training, Visual/preceptual remediation/compensation, DME instructions, Cryotherapy, and Moist heat.  All performed as medically necessary.  All included unless contraindicated  PLAN FOR NEXT SESSION: Continued balance challenges.  Check PF strengtn.    Ozell Silvan, PT, DPT, OCS, ATC 06/23/2024  10:46  AM     [employer] Auth needed.     "

## 2024-06-28 ENCOUNTER — Ambulatory Visit (INDEPENDENT_AMBULATORY_CARE_PROVIDER_SITE_OTHER): Admitting: Rehabilitative and Restorative Service Providers"

## 2024-06-28 ENCOUNTER — Encounter: Payer: Self-pay | Admitting: Rehabilitative and Restorative Service Providers"

## 2024-06-28 DIAGNOSIS — M6281 Muscle weakness (generalized): Secondary | ICD-10-CM | POA: Diagnosis not present

## 2024-06-28 DIAGNOSIS — M79605 Pain in left leg: Secondary | ICD-10-CM

## 2024-06-28 DIAGNOSIS — M25572 Pain in left ankle and joints of left foot: Secondary | ICD-10-CM

## 2024-06-28 DIAGNOSIS — R262 Difficulty in walking, not elsewhere classified: Secondary | ICD-10-CM | POA: Diagnosis not present

## 2024-06-28 NOTE — Therapy (Signed)
 " OUTPATIENT PHYSICAL THERAPY TREATMENT   Patient Name: Kari Pineda MRN: [id number] DOB:[birth year], 63 y.o., female Today's Date: 06/28/2024  END OF SESSION:  PT End of Session - 06/28/24 0847     Visit Number 9    Number of Visits 20    Date for Recertification  07/10/24    Authorization Type [employer] auth needed,   no copay    Authorization Time Period -07/10/2024    Authorization - Visit Number 9    Authorization - Number of Visits 20    Progress Note Due on Visit 10    PT Start Time 0842    PT Stop Time 0922    PT Time Calculation (min) 40 min    Activity Tolerance Patient tolerated treatment well    Behavior During Therapy [facility] for tasks assessed/performed                  History reviewed. No pertinent past medical history. Past Surgical History:  Procedure Laterality Date   APPENDECTOMY     CHOLECYSTECTOMY     TONSILLECTOMY     WRIST SURGERY     There are no active problems to display for this patient.   PCP: Burney Pao MD  REFERRING PROVIDER: Gerome Maurilio HERO, PA-C  REFERRING DIAG: [phone] (ICD-10-CM) - Impingement of anterior part of ankle M77.9 (ICD-10-CM) - Tendonitis  THERAPY DIAG:  Pain in left ankle and joints of left foot  Pain in left leg  Muscle weakness (generalized)  Difficulty in walking, not elsewhere classified  Rationale for Evaluation and Treatment: Rehabilitation  ONSET DATE:  02/11/2024  SUBJECTIVE:   SUBJECTIVE STATEMENT: Pt indicated a couple incidences with stairs (going up) where Lt foot turned in and created quick pain up leg.  Pt indicated about 30 mins of symptoms.  Otherwise reported ok.   PERTINENT HISTORY:   PAIN:  NPRS scale:0/10 Pain location: Lt ankle anteriorly.  Can shoot up leg.  Pain description: sharp, shooting.  Aggravating factors: prolonged WB activity, walking, standing.  Relieving factors: rest, inversion.   PRECAUTIONS: None  WEIGHT BEARING RESTRICTIONS: No  FALLS:  Has patient  fallen in last 6 months? No  LIVING ENVIRONMENT: Lives in: House/apartment Stairs: flight of stairs to bedroom.   No stairs in garage.  Front porch with 3 stairs with bilateral rails.    OCCUPATION: [employer] (unable to work since injury.   PLOF: Independent, hiking, play softball/basketball.   PATIENT GOALS: Reduce pain, get back to activities.  Walk normal.  OBJECTIVE:   DIAGNOSTIC FINDINGS:  X rays 03/06/2024 B knee with medial joint line narrowing Ankle no fracture noted, mortis intact  PATIENT SURVEYS:  Patient-Specific Activity Scoring Scheme  0 represents unable to perform. 10 represents able to perform at prior level. 0 1 2 3 4 5 6 7 8 9  10 (Date and Score)   Activity Eval  05/01/2024  05/30/2024  1. Hike 3   6  2. Walk outside  3  10  3. Driving 4 7  4. Clean house 4 10  5. work 2 6  Score 3.2 7.8   Total score = sum of the activity scores/number of activities Minimum detectable change (90%CI) for average score = 2 points Minimum detectable change (90%CI) for single activity score = 3 points  COGNITION: 05/01/2024 Overall cognitive status: WFL    SENSATION: 05/01/2024 Not specific tested for dermatomes.   EDEMA:  05/01/2024 Figure 8 Lt ankle: 49 cm Figure 8 Rt ankle: 49 cms  MUSCLE LENGTH: 05/01/2024 No specific testing today.   POSTURE:  05/01/2024 Deviation off Lt leg in standing posture.   PALPATION: 05/01/2024 Tenderness anterior joint line Lt talocrural joint, lateral joint line inferior to lateral malleolus.   LOWER EXTREMITY MMT:    Right Eval 05/01/2024 Left Eval 05/01/2024 Left 05/30/2024 Left 06/16/2024 Left 06/28/2024  Hip flexion       Hip extension       Hip abduction       Hip adduction       Hip internal rotation       Hip external rotation       Knee flexion 5/5 4/5 5/5    Knee extension 5/5 4/5 5/5    Ankle dorsiflexion 5/5 3+/5 4+/5    Ankle plantarflexion  2+/5  3+/5 (2-3 reps in SL heel raise)  4/5 (able to reach 10 lifts)   Ankle inversion 5/5 3+/5 5/5    Ankle eversion 5/5 3+/5 4/5  4/5   (Blank rows = not tested)  LOWER EXTREMITY ROM:   Right Eval 05/01/2024 Left Eval 05/01/2024 Left 05/23/2024 AROM Left 06/05/2024 Left 06/23/2024  Hip flexion       Hip extension       Hip abduction       Hip adduction       Hip internal rotation       Hip external rotation       Knee flexion       Knee extension       Ankle dorsiflexion 13 AROM in 90 deg knee flexion -9 AROM in 90 deg in knee flexion With pain  -11 AROM in 0 deg knee extension    0 deg in 0 deg knee extension long sitting 6 deg in 90 deg knee flexion   6 deg in 0 deg knee flexion  10 deg in 90 deg knee flexion    8 deg in 0 deg knee flexion   Ankle plantarflexion       Ankle inversion 32 22 c pain 35 40   Ankle eversion 32 12 c pain 30 40    (Blank rows = not tested)  LOWER EXTREMITY SPECIAL TESTS:  05/01/2024 No specific testing performed.   FUNCTIONAL TESTS:  06/20/2024: Lt leg SLS: 3-5 seconds on attempt   05/01/2024 18 inch chair transfer: able on 1st try with weight deviation to Rt due to pain.  Lt SLS no shoes:  unable unassisted, pain noted.  Rt SLS  no shoes: 30 seconds   GAIT: 05/01/2024 Independent ambulation c deviation in stance on Lt leg, lacking toe off progression due to antalgic gait, DF restriction.  TODAY'S TREATMENT                                                                          DATE: 06/28/2024 Therex: Nustep lvl 6 UE/LE for ROM 6 mins Blue band Lt ankle eversion 2 x 15, eccentric only x 10  Single leg PF Lt x 10  Incline gastroc stretch 30 sec x 3  Seated SLR Lt 2 x 10  TherActivity Step on over and down WB on Lt leg 6 inch step x 6 (popping noted) changed to 4 inch step x 10    Neuro Re-ed  (muscle activation, coordination) Lt ankle eversion in long sitting isometric hold 10 sec x 5, reactive to clinician resistance through range x 10  SLS with slight knee bend 30 sec x 3 bilaterally with occasional HHA to correct LOB.  Cross over carioca stepping 15 ft x 2 each way (medial ankle symptoms noted on Lt leg when going Rt)  Lateral stepping 3 cones x 6 each, performed facing each way.    TODAY'S TREATMENT                                                                          DATE: 06/23/2024 Therex: Recumbent bike seat 4 for ROM.  Stopped at 5 mins due to knee tightness/pulling complaints Seated Lt hamstring stretch 30 sec x 3 to tolerance.  (Helped improve symptoms)  Seated legs outstretched green band around midfoot eversion x 15 with slow movement focus Lateral stepping c green band around midfoot with eversion focus 15 ft x 3 each way  Incline stretch 30 sec alternating with double leg PF with slow lowering focus    Neuro Re-ed (muscle activation, coordination) SLS on foam with occasionally HHA on bar 30 sec x 3 alternating bilaterally  Fitter rocker board fwd/back light touching focus bilateral LE x 25 each way Forward step up on 6 inch step with airex foam on top with single leg balance hold 1-2 seconds with slow reverse step down - x 10 bilaterally with occasional to moderate HHA     TODAY'S TREATMENT                                                                          DATE: 06/20/2024 Therex: Nustep lvl 6 10 mins for ROM, endurance.  UE /LE use.  Focus on keeping heel down.  Stnading PF double leg up, majority on Lt coming down 2 x 10 with hands on bar Step on over and down 4 inch step WB on Lt leg x 10  Incline gastroc stretch 30 sec x 5 in runner position with Lt leg lower  Neuro Re-ed (muscle  activation, coordination) Fitter rocker board fwd/back light touching bilateral LE x 25 touches each way Fitter rocker board hoirzontal balance attempts for 1 min fwd/back  bilateral LE SLS with contralateral leg tapping corners of foam x 8 each, performed bilaterally  Step to pattern over 9 inch hurdle forward 10 ft x 5 each leg leading.  Lateral step to pattern 9 inch hurdle 10 ft x 5 each way    TODAY'S TREATMENT                                                                          DATE: 06/16/2024 Therex: Nustep lvl 6 10 mins for Rom, endurance.  UE/LE use Standing PF with feet on incline board with majority of weight lowering eccentrically on Lt 2 x 10 (calf stretching after each rep) Incline gastroc stretch bilateral 30 sec x 5  Long sitting blue band inversion, eversion 2 x 15 each .    Neuro Re-ed (muscle activation, coordination) Tandem stance 1 min x 1 bilaterally Tandem ambulation on foam in // bars with occasional HHA 6 ft x 6 each way with slow control focus.  SLS on foam with contralateral leg over and back tap 9 inch hurdle (ends up being a little less due to foam height)  x 15 bilaterally  Alternating heel/toe lift bilaterally on foam with minimal HHA on bar x 20 each way    PATIENT EDUCATION:  06/16/2024  Education details: HEP update Person educated: Patient Education method: Programmer, Multimedia, Demonstration, Verbal cues, and Handouts Education comprehension: verbalized understanding, returned demonstration, and verbal cues required  HOME EXERCISE PROGRAM: Access Code: [email or web address] URL: [email or web address] Date: 06/16/2024 Prepared by: Ozell Silvan  Exercises - Long Sitting Calf Stretch with Strap  - 2-3 x daily - 7 x weekly - 1 sets - 3-5 reps - 30 hold - Long Sitting Ankle Eversion with Resistance  - 2 x daily - 7 x weekly - 3 sets - 10 reps - Long Sitting Ankle Inversion with Resistance  - 2 x daily - 7 x weekly - 3 sets - 10 reps - Long Sitting Ankle Dorsiflexion with Anchored Resistance (Mirrored)  - 1-2 x daily - 7 x weekly - 2-3 sets - 10-15 reps - [place]  - 1-2 x daily - 7 x weekly - 1 sets - 1-2 mins hold -  Tandem Stance in Corner  - 1-2 x daily - 7 x weekly - 1 sets - 3-5 reps - 30 hold - Heel Raises with Counter Support  - 1 x daily - 7 x weekly - 2-3 sets - 10 reps - Standing Gastroc Stretch on Step  - 2-3 x daily - 7 x weekly - 1 sets - 5 reps - 30 sec hold  ASSESSMENT:  CLINICAL IMPRESSION: Crepitus noted in posterior lateral ankle with eversion attempts and noted in step down control on 6 inch step.  Pt to benefit from continued skilled PT services to progress strength control, specifically in eversion to faciltiate correction of inversion incidents reported.   OBJECTIVE IMPAIRMENTS: Abnormal gait, decreased activity tolerance, decreased balance, decreased coordination, decreased endurance, decreased mobility, difficulty walking, decreased ROM, decreased strength, increased fascial restrictions, impaired perceived functional ability, impaired flexibility, improper body mechanics, and  pain.   ACTIVITY LIMITATIONS: lifting, bending, sitting, standing, squatting, sleeping, stairs, transfers, bed mobility, and locomotion level  PARTICIPATION LIMITATIONS: meal prep, cleaning, laundry, interpersonal relationship, driving, shopping, community activity, and occupation  PERSONAL FACTORS: No specific factors are also affecting patient's functional outcome.   REHAB POTENTIAL: Good  CLINICAL DECISION MAKING: Stable/uncomplicated  EVALUATION COMPLEXITY: Low   GOALS: Goals reviewed with patient? Yes  SHORT TERM GOALS: (target date for Short term goals are 3 weeks 05/22/2024)   1.  Patient will demonstrate independent use of home exercise program to maintain progress from in clinic treatments.  Goal status: Met in review 05/23/2024  LONG TERM GOALS: (target dates for all long term goals are 10 weeks  07/10/2024 )   1. Patient will demonstrate/report pain at worst less than or equal to 2/10 to facilitate minimal limitation in daily activity secondary to pain symptoms.  Goal status: on going  06/23/2024   2. Patient will demonstrate independent use of home exercise program to facilitate ability to maintain/progress functional gains from skilled physical therapy services.  Goal status: on going 06/23/2024   3. Patient will demonstrate Patient specific functional scale avg > or = 8/10 to indicate reduced disability due to condition.   Goal status: on going 06/23/2024   4.  Patient will demonstrate Lt  LE MMT 5/5 throughout to faciltiate usual transfers, stairs, squatting at [facility] for daily life.   Goal status: on going 06/23/2024   5.  Patient will demonstrate Lt ankle AROM equal to Rt to facilitate mobility for ambulation, daily activity at PLOF.  Goal status: on going 06/23/2024   6. Patient will demonstrate ascending/descending stairs reciprocally s UE assist for community integration.   Goal status: on going 06/23/2024   7.  Patient will report blase return to work.  Goal Status: on going 06/23/2024  8. Patient will demonstrate independent ambulation s deviation for normal recreational walking, work walking.  Goal status: on going 06/23/2024   PLAN:  PT FREQUENCY: 1-2x/week  PT DURATION: 10 weeks  PLANNED INTERVENTIONS: Can include 02853- PT Re-evaluation, 97110-Therapeutic exercises, 97530- Therapeutic activity, 97112- Neuromuscular re-education, 97535- Self Care, 97140- Manual therapy, [phone]- Gait training, [phone]- Orthotic Fit/training, [phone]- Canalith repositioning, [id number]- Aquatic Therapy, [phone]- Electrical stimulation (unattended), [id number] Physical performance testing, 97016- Vasopneumatic device, [id number]- Ultrasound, [id number]- Traction (mechanical), [id number]- Ionotophoresis 4mg /ml Dexamethasone,  20560 - Needle insertion w/o injection 1 or 2 muscles, 20561 - Needle insertion w/o injection 3 or more muscles.   Patient/Family education, Balance training, Stair training, Taping, Dry Needling, Joint mobilization, Joint manipulation, Spinal manipulation, Spinal mobilization, Scar  mobilization, Vestibular training, Visual/preceptual remediation/compensation, DME instructions, Cryotherapy, and Moist heat.  All performed as medically necessary.  All included unless contraindicated  PLAN FOR NEXT SESSION: Continued eversion strength/control gains.    [employer] auth ends 07/10/2024  Ozell Silvan, PT, DPT, OCS, ATC 06/28/2024  9:21 AM      [employer] Auth needed.     "

## 2024-07-05 ENCOUNTER — Ambulatory Visit: Payer: Self-pay | Admitting: Rehabilitative and Restorative Service Providers"

## 2024-07-05 ENCOUNTER — Encounter: Payer: Self-pay | Admitting: Rehabilitative and Restorative Service Providers"

## 2024-07-05 DIAGNOSIS — M6281 Muscle weakness (generalized): Secondary | ICD-10-CM

## 2024-07-05 DIAGNOSIS — R262 Difficulty in walking, not elsewhere classified: Secondary | ICD-10-CM

## 2024-07-05 DIAGNOSIS — M25572 Pain in left ankle and joints of left foot: Secondary | ICD-10-CM

## 2024-07-05 DIAGNOSIS — M79605 Pain in left leg: Secondary | ICD-10-CM

## 2024-07-05 NOTE — Therapy (Signed)
 " OUTPATIENT PHYSICAL THERAPY TREATMENT / PROGRESS NOTE / RECERT   Patient Name: Kari Pineda MRN: [id number] DOB:May 03, 1961, 64 y.o., female Today's Date: 07/05/2024  Progress Note Reporting Period 05/01/2024  to 07/05/2024  See note below for Objective Data and Assessment of Progress/Goals.      END OF SESSION:  PT End of Session - 07/05/24 1105     Visit Number 10    Number of Visits 20    Date for Recertification  07/10/24    Authorization Type [employer] auth needed,   no copay    Authorization Time Period -07/10/2024    Authorization - Visit Number 10    Authorization - Number of Visits 20    Progress Note Due on Visit 20    PT Start Time 1102    PT Stop Time 1142    PT Time Calculation (min) 40 min    Activity Tolerance Patient tolerated treatment well    Behavior During Therapy [facility] for tasks assessed/performed            History reviewed. No pertinent past medical history. Past Surgical History:  Procedure Laterality Date   APPENDECTOMY     CHOLECYSTECTOMY     TONSILLECTOMY     WRIST SURGERY     There are no active problems to display for this patient.   PCP: Burney Pao MD  REFERRING PROVIDER: Gerome Maurilio HERO, PA-C  REFERRING DIAG: [phone] (ICD-10-CM) - Impingement of anterior part of ankle M77.9 (ICD-10-CM) - Tendonitis  THERAPY DIAG:  Pain in left ankle and joints of left foot  Pain in left leg  Muscle weakness (generalized)  Difficulty in walking, not elsewhere classified  Rationale for Evaluation and Treatment: Rehabilitation  ONSET DATE:  02/11/2024  SUBJECTIVE:   SUBJECTIVE STATEMENT: Pt indicated driving to [place]  with short stay.  Reported stiffness, achy at times.   Pt indicated working on the exercise.   Pt indicated the giving way feeling twice going up stairs.  60% improvement noted at this time.   PERTINENT HISTORY:   PAIN:  NPRS scale:  up to 8/10 with quick movement , usually not painful outside of those.  Pain  location: Lt ankle anteriorly.  Can shoot up leg.  Pain description: sharp, shooting.  Aggravating factors: prolonged WB activity, walking, standing.  Relieving factors: rest, inversion.   PRECAUTIONS: None  WEIGHT BEARING RESTRICTIONS: No  FALLS:  Has patient fallen in last 6 months? No  LIVING ENVIRONMENT: Lives in: House/apartment Stairs: flight of stairs to bedroom.   No stairs in garage.  Front porch with 3 stairs with bilateral rails.    OCCUPATION: [employer], [employer] (unable to work since injury.   PLOF: Independent, hiking, play softball/basketball.   PATIENT GOALS: Reduce pain, get back to activities.  Walk normal.  OBJECTIVE:   DIAGNOSTIC FINDINGS:  X rays 03/06/2024 B knee with medial joint line narrowing Ankle no fracture noted, mortis intact  PATIENT SURVEYS:  Patient-Specific Activity Scoring Scheme  0 represents unable to perform. 10 represents able to perform at prior level. 0 1 2 3 4 5 6 7 8 9  10 (Date and Score)   Activity Eval  05/01/2024  05/30/2024 07/05/2024  1. Hike 3   6 5   2. Walk outside  3  10 10   3. Driving 4 7 9   4. Clean house 4 10 10   5. work 2 6 7   Score 3.2 7.8 8.2   Total score = sum of the activity scores/number of activities Minimum detectable  change (90%CI) for average score = 2 points Minimum detectable change (90%CI) for single activity score = 3 points  COGNITION: 05/01/2024 Overall cognitive status: WFL    SENSATION: 05/01/2024 Not specific tested for dermatomes.   EDEMA:  05/01/2024 Figure 8 Lt ankle: 49 cm Figure 8 Rt ankle: 49 cms  MUSCLE LENGTH: 05/01/2024 No specific testing today.   POSTURE:  05/01/2024 Deviation off Lt leg in standing posture.   PALPATION: 05/01/2024 Tenderness anterior joint line Lt talocrural joint, lateral joint line inferior to lateral malleolus.   LOWER EXTREMITY MMT:    Right Eval 05/01/2024 Left Eval 05/01/2024 Left 05/30/2024 Left 06/16/2024 Left 06/28/2024  Left 06/28/2024  Hip flexion        Hip extension        Hip abduction        Hip adduction        Hip internal rotation        Hip external rotation        Knee flexion 5/5 4/5 5/5     Knee extension 5/5 4/5 5/5     Ankle dorsiflexion 5/5 3+/5 4+/5   5/5  Ankle plantarflexion  2+/5  3+/5 (2-3 reps in SL heel raise) 4/5 (able to reach 10 lifts)    Ankle inversion 5/5 3+/5 5/5   5/5  Ankle eversion 5/5 3+/5 4/5  4/5 4/5   (Blank rows = not tested)  LOWER EXTREMITY ROM:   Right Eval 05/01/2024 Left Eval 05/01/2024 Left 05/23/2024 AROM Left 06/05/2024 Left 06/23/2024 Left 07/05/2024  Hip flexion        Hip extension        Hip abduction        Hip adduction        Hip internal rotation        Hip external rotation        Knee flexion        Knee extension        Ankle dorsiflexion 13 AROM in 90 deg knee flexion -9 AROM in 90 deg in knee flexion With pain  -11 AROM in 0 deg knee extension    0 deg in 0 deg knee extension long sitting 6 deg in 90 deg knee flexion   6 deg in 0 deg knee flexion  10 deg in 90 deg knee flexion    8 deg in 0 deg knee flexion  10 deg in 0 deg knee flexion   Ankle plantarflexion        Ankle inversion 32 22 c pain 35 40  44  Ankle eversion 32 12 c pain 30 40  48   (Blank rows = not tested)  LOWER EXTREMITY SPECIAL TESTS:  05/01/2024 No specific testing performed.   FUNCTIONAL TESTS:  06/20/2024: Lt leg SLS: 3-5 seconds on attempt   05/01/2024 18 inch chair transfer: able on 1st try with weight deviation to Rt due to pain.  Lt SLS no shoes:  unable unassisted, pain noted.  Rt SLS  no shoes: 30 seconds   GAIT: 05/01/2024 Independent ambulation c deviation in stance on Lt leg, lacking toe off progression due to antalgic gait, DF restriction.  TODAY'S TREATMENT                                                                           DATE: 07/05/2024 Therex: Nustep Lvl 6 8 mins UE/LE for ROM Blue band eccentric eversion control 3 x 10 Lt leg in long sitting.   Neuro Re-ed (muscle activation, coordination) Tandem stance on foam without shoes with slow head turns Lt and Rt during 1 min x 1 bilaterally  BAPS board sitting with 1 lb weight on lateral side of foot:  fwd/back x 20, side to side x 20, cw circles x 15, ccw circles x 15  Lateral stepping on foam over 9 inch hurdles in // bars with occasional HHA x 5 each way  Step over high knee march on foam 9 inch hurdle reciprocally in // bars with occasional HHA  3 hurdles x 12   TODAY'S TREATMENT                                                                          DATE: 06/28/2024 Therex: Nustep lvl 6 UE/LE for ROM 6 mins Blue band Lt ankle eversion 2 x 15, eccentric only x 10  Single leg PF Lt x 10  Incline gastroc stretch 30 sec x 3  Seated SLR Lt 2 x 10  TherActivity Step on over and down WB on Lt leg 6 inch step x 6 (popping noted) changed to 4 inch step x 10    Neuro Re-ed (muscle activation, coordination) Lt ankle eversion in long sitting isometric hold 10 sec x 5, reactive to clinician resistance through range x 10  SLS with slight knee bend 30 sec x 3 bilaterally with occasional HHA to correct LOB.  Cross over carioca stepping 15 ft x 2 each way (medial ankle symptoms noted on Lt leg when going Rt)  Lateral stepping 3 cones x 6 each, performed facing each way.    TODAY'S TREATMENT                                                                          DATE: 06/23/2024 Therex: Recumbent bike seat 4 for ROM.  Stopped at 5 mins due to knee tightness/pulling complaints Seated Lt hamstring stretch 30 sec x 3 to tolerance.  (Helped improve symptoms)  Seated legs outstretched green band around midfoot eversion x 15 with slow movement focus Lateral stepping c green band around midfoot with eversion  focus 15 ft x 3 each way  Incline stretch 30 sec alternating with double leg PF with slow lowering focus    Neuro Re-ed (muscle activation, coordination) SLS on foam with occasionally HHA on bar 30 sec x 3  alternating bilaterally  Fitter rocker board fwd/back light touching focus bilateral LE x 25 each way Forward step up on 6 inch step with airex foam on top with single leg balance hold 1-2 seconds with slow reverse step down - x 10 bilaterally with occasional to moderate HHA   PATIENT EDUCATION:  06/16/2024  Education details: HEP update Person educated: Patient Education method: Programmer, Multimedia, Demonstration, Verbal cues, and Handouts Education comprehension: verbalized understanding, returned demonstration, and verbal cues required  HOME EXERCISE PROGRAM: Access Code: [email or web address] URL: [email or web address] Date: 06/16/2024 Prepared by: Ozell Silvan  Exercises - Long Sitting Calf Stretch with Strap  - 2-3 x daily - 7 x weekly - 1 sets - 3-5 reps - 30 hold - Long Sitting Ankle Eversion with Resistance  - 2 x daily - 7 x weekly - 3 sets - 10 reps - Long Sitting Ankle Inversion with Resistance  - 2 x daily - 7 x weekly - 3 sets - 10 reps - Long Sitting Ankle Dorsiflexion with Anchored Resistance (Mirrored)  - 1-2 x daily - 7 x weekly - 2-3 sets - 10-15 reps - [place]  - 1-2 x daily - 7 x weekly - 1 sets - 1-2 mins hold - Tandem Stance in Corner  - 1-2 x daily - 7 x weekly - 1 sets - 3-5 reps - 30 hold - Heel Raises with Counter Support  - 1 x daily - 7 x weekly - 2-3 sets - 10 reps - Standing Gastroc Stretch on Step  - 2-3 x daily - 7 x weekly - 1 sets - 5 reps - 30 sec hold  ASSESSMENT:  CLINICAL IMPRESSION: The patient has attended 10 visits over the course of treatment cycle.  Patient has reported overall improvement at 60%. See objective data above for updated information regarding current presentation.  Gains have been noted in all areas but continued instances of  instability on Lt ankle, particularly in stance on stairs.  Continued deficits noted in eversion strength control.  Continued skilled PT services medically necessary to make progressions towards safe return to hiking and other WB tasks such as work.    OBJECTIVE IMPAIRMENTS: Abnormal gait, decreased activity tolerance, decreased balance, decreased coordination, decreased endurance, decreased mobility, difficulty walking, decreased ROM, decreased strength, increased fascial restrictions, impaired perceived functional ability, impaired flexibility, improper body mechanics, and pain.   ACTIVITY LIMITATIONS: lifting, bending, sitting, standing, squatting, sleeping, stairs, transfers, bed mobility, and locomotion level  PARTICIPATION LIMITATIONS: meal prep, cleaning, laundry, interpersonal relationship, driving, shopping, community activity, and occupation  PERSONAL FACTORS: No specific factors are also affecting patient's functional outcome.   REHAB POTENTIAL: Good  CLINICAL DECISION MAKING: Stable/uncomplicated  EVALUATION COMPLEXITY: Low   GOALS: Goals reviewed with patient? Yes  SHORT TERM GOALS: (target date for Short term goals are 3 weeks 05/22/2024)   1.  Patient will demonstrate independent use of home exercise program to maintain progress from in clinic treatments.  Goal status: Met in review 05/23/2024  LONG TERM GOALS: (target dates for all long term goals are 8 weeks  08/30/2024 )   1. Patient will demonstrate/report pain at worst less than or equal to 2/10 to facilitate minimal limitation in daily activity secondary to pain symptoms.  Goal status: on going 07/05/2024   2. Patient will demonstrate independent use of home exercise program to facilitate ability to maintain/progress functional gains from skilled physical therapy services.  Goal status: on going 07/05/2024   3. Patient will demonstrate Patient specific functional scale  avg > or = 8/10 to indicate reduced  disability due to condition.   Goal status:Met 07/05/2024   4.  Patient will demonstrate Lt  LE MMT 5/5 throughout to faciltiate usual transfers, stairs, squatting at [facility] for daily life.   Goal status: on going 07/05/2024   5.  Patient will demonstrate Lt ankle AROM equal to Rt to facilitate mobility for ambulation, daily activity at PLOF.  Goal status: on going 07/05/2024   6. Patient will demonstrate ascending/descending stairs reciprocally s UE assist for community integration.   Goal status: Met 07/05/2024 but does have instances of instability   7.  Patient will report blase return to work.  Goal Status: on going 07/05/2024  8. Patient will demonstrate independent ambulation s deviation for normal recreational walking, work walking.  Goal status: on going 07/05/2024   PLAN:  PT FREQUENCY: 1x/week  PT DURATION: 8 weeks  PLANNED INTERVENTIONS: Can include 02853- PT Re-evaluation, 97110-Therapeutic exercises, 97530- Therapeutic activity, 97112- Neuromuscular re-education, 97535- Self Care, 97140- Manual therapy, [phone]- Gait training, [phone]- Orthotic Fit/training, [phone]- Canalith repositioning, [id number]- Aquatic Therapy, [phone]- Electrical stimulation (unattended), [id number] Physical performance testing, 97016- Vasopneumatic device, [id number]- Ultrasound, [id number]- Traction (mechanical), [id number]- Ionotophoresis 4mg /ml Dexamethasone,  20560 - Needle insertion w/o injection 1 or 2 muscles, 20561 - Needle insertion w/o injection 3 or more muscles.   Patient/Family education, Balance training, Stair training, Taping, Dry Needling, Joint mobilization, Joint manipulation, Spinal manipulation, Spinal mobilization, Scar mobilization, Vestibular training, Visual/preceptual remediation/compensation, DME instructions, Cryotherapy, and Moist heat.  All performed as medically necessary.  All included unless contraindicated  PLAN FOR NEXT SESSION: Eversion strength gains, compliant surface and lateral movement  control.   [employer] auth ends 07/10/2024  Ozell Silvan, PT, DPT, OCS, ATC 07/05/24  11:50 AM      [employer] Auth needed.     "

## 2024-07-12 ENCOUNTER — Encounter: Admitting: Rehabilitative and Restorative Service Providers"

## 2024-07-12 NOTE — Therapy (Incomplete)
 " OUTPATIENT PHYSICAL THERAPY TREATMENT   Patient Name: Kari Pineda MRN: [id number] DOB:May 24, 1961, 64 y.o., female Today's Date: 07/12/2024   END OF SESSION:      No past medical history on file. Past Surgical History:  Procedure Laterality Date   APPENDECTOMY     CHOLECYSTECTOMY     TONSILLECTOMY     WRIST SURGERY     There are no active problems to display for this patient.   PCP: Burney Pao MD  REFERRING PROVIDER: Gerome Maurilio HERO, PA-C  REFERRING DIAG: [phone] (ICD-10-CM) - Impingement of anterior part of ankle M77.9 (ICD-10-CM) - Tendonitis  THERAPY DIAG:  No diagnosis found.  Rationale for Evaluation and Treatment: Rehabilitation  ONSET DATE:  02/11/2024  SUBJECTIVE:   SUBJECTIVE STATEMENT: Pt indicated driving to [place]  with short stay.  Reported stiffness, achy at times.   Pt indicated working on the exercise.   Pt indicated the giving way feeling twice going up stairs.  60% improvement noted at this time.   PERTINENT HISTORY:   PAIN:  NPRS scale:  up to 8/10 with quick movement , usually not painful outside of those.  Pain location: Lt ankle anteriorly.  Can shoot up leg.  Pain description: sharp, shooting.  Aggravating factors: prolonged WB activity, walking, standing.  Relieving factors: rest, inversion.   PRECAUTIONS: None  WEIGHT BEARING RESTRICTIONS: No  FALLS:  Has patient fallen in last 6 months? No  LIVING ENVIRONMENT: Lives in: House/apartment Stairs: flight of stairs to bedroom.   No stairs in garage.  Front porch with 3 stairs with bilateral rails.    OCCUPATION: [employer] (unable to work since injury.   PLOF: Independent, hiking, play softball/basketball.   PATIENT GOALS: Reduce pain, get back to activities.  Walk normal.  OBJECTIVE:   DIAGNOSTIC FINDINGS:  X rays 03/06/2024 B knee with medial joint line narrowing Ankle no fracture noted, mortis intact  PATIENT SURVEYS:  Patient-Specific Activity Scoring  Scheme  0 represents unable to perform. 10 represents able to perform at prior level. 0 1 2 3 4 5 6 7 8 9  10 (Date and Score)   Activity Eval  05/01/2024  05/30/2024 07/05/2024  1. Hike 3   6 5   2. Walk outside  3  10 10   3. Driving 4 7 9   4. Clean house 4 10 10   5. work 2 6 7   Score 3.2 7.8 8.2   Total score = sum of the activity scores/number of activities Minimum detectable change (90%CI) for average score = 2 points Minimum detectable change (90%CI) for single activity score = 3 points  COGNITION: 05/01/2024 Overall cognitive status: WFL    SENSATION: 05/01/2024 Not specific tested for dermatomes.   EDEMA:  05/01/2024 Figure 8 Lt ankle: 49 cm Figure 8 Rt ankle: 49 cms  MUSCLE LENGTH: 05/01/2024 No specific testing today.   POSTURE:  05/01/2024 Deviation off Lt leg in standing posture.   PALPATION: 05/01/2024 Tenderness anterior joint line Lt talocrural joint, lateral joint line inferior to lateral malleolus.   LOWER EXTREMITY MMT:    Right Eval 05/01/2024 Left Eval 05/01/2024 Left 05/30/2024 Left 06/16/2024 Left 06/28/2024 Left 06/28/2024  Hip flexion        Hip extension        Hip abduction        Hip adduction        Hip internal rotation        Hip external rotation        Knee flexion 5/5 4/5  5/5     Knee extension 5/5 4/5 5/5     Ankle dorsiflexion 5/5 3+/5 4+/5   5/5  Ankle plantarflexion  2+/5  3+/5 (2-3 reps in SL heel raise) 4/5 (able to reach 10 lifts)    Ankle inversion 5/5 3+/5 5/5   5/5  Ankle eversion 5/5 3+/5 4/5  4/5 4/5   (Blank rows = not tested)  LOWER EXTREMITY ROM:   Right Eval 05/01/2024 Left Eval 05/01/2024 Left 05/23/2024 AROM Left 06/05/2024 Left 06/23/2024 Left 07/05/2024  Hip flexion        Hip extension        Hip abduction        Hip adduction        Hip internal rotation        Hip external rotation        Knee flexion        Knee extension        Ankle dorsiflexion 13 AROM in 90 deg knee flexion  -9 AROM in 90 deg in knee flexion With pain  -11 AROM in 0 deg knee extension    0 deg in 0 deg knee extension long sitting 6 deg in 90 deg knee flexion   6 deg in 0 deg knee flexion  10 deg in 90 deg knee flexion    8 deg in 0 deg knee flexion  10 deg in 0 deg knee flexion   Ankle plantarflexion        Ankle inversion 32 22 c pain 35 40  44  Ankle eversion 32 12 c pain 30 40  48   (Blank rows = not tested)  LOWER EXTREMITY SPECIAL TESTS:  05/01/2024 No specific testing performed.   FUNCTIONAL TESTS:  06/20/2024: Lt leg SLS: 3-5 seconds on attempt   05/01/2024 18 inch chair transfer: able on 1st try with weight deviation to Rt due to pain.  Lt SLS no shoes:  unable unassisted, pain noted.  Rt SLS  no shoes: 30 seconds   GAIT: 05/01/2024 Independent ambulation c deviation in stance on Lt leg, lacking toe off progression due to antalgic gait, DF restriction.                                                                                                                                                                          TODAY'S TREATMENT  DATE: 07/12/2024 Therex:     TODAY'S TREATMENT                                                                          DATE: 07/05/2024 Therex: Nustep Lvl 6 8 mins UE/LE for ROM Blue band eccentric eversion control 3 x 10 Lt leg in long sitting.   Neuro Re-ed (muscle activation, coordination) Tandem stance on foam without shoes with slow head turns Lt and Rt during 1 min x 1 bilaterally  BAPS board sitting with 1 lb weight on lateral side of foot:  fwd/back x 20, side to side x 20, cw circles x 15, ccw circles x 15  Lateral stepping on foam over 9 inch hurdles in // bars with occasional HHA x 5 each way  Step over high knee march on foam 9 inch hurdle reciprocally in // bars with occasional HHA  3 hurdles x 12   TODAY'S TREATMENT                                                                           DATE: 06/28/2024 Therex: Nustep lvl 6 UE/LE for ROM 6 mins Blue band Lt ankle eversion 2 x 15, eccentric only x 10  Single leg PF Lt x 10  Incline gastroc stretch 30 sec x 3  Seated SLR Lt 2 x 10  TherActivity Step on over and down WB on Lt leg 6 inch step x 6 (popping noted) changed to 4 inch step x 10    Neuro Re-ed (muscle activation, coordination) Lt ankle eversion in long sitting isometric hold 10 sec x 5, reactive to clinician resistance through range x 10  SLS with slight knee bend 30 sec x 3 bilaterally with occasional HHA to correct LOB.  Cross over carioca stepping 15 ft x 2 each way (medial ankle symptoms noted on Lt leg when going Rt)  Lateral stepping 3 cones x 6 each, performed facing each way.    TODAY'S TREATMENT                                                                          DATE: 06/23/2024 Therex: Recumbent bike seat 4 for ROM.  Stopped at 5 mins due to knee tightness/pulling complaints Seated Lt hamstring stretch 30 sec x 3 to tolerance.  (Helped improve symptoms)  Seated legs outstretched green band around midfoot eversion x 15 with slow movement focus Lateral stepping c green band around midfoot with eversion focus 15 ft x 3 each way  Incline stretch 30 sec alternating with double leg PF with slow lowering focus    Neuro Re-ed (muscle activation, coordination) SLS on foam with occasionally HHA  on bar 30 sec x 3 alternating bilaterally  Fitter rocker board fwd/back light touching focus bilateral LE x 25 each way Forward step up on 6 inch step with airex foam on top with single leg balance hold 1-2 seconds with slow reverse step down - x 10 bilaterally with occasional to moderate HHA   PATIENT EDUCATION:  06/16/2024  Education details: HEP update Person educated: Patient Education method: Programmer, Multimedia, Demonstration, Verbal cues, and Handouts Education comprehension: verbalized understanding, returned  demonstration, and verbal cues required  HOME EXERCISE PROGRAM: Access Code: [email or web address] URL: [email or web address] Date: 06/16/2024 Prepared by: Ozell Silvan  Exercises - Long Sitting Calf Stretch with Strap  - 2-3 x daily - 7 x weekly - 1 sets - 3-5 reps - 30 hold - Long Sitting Ankle Eversion with Resistance  - 2 x daily - 7 x weekly - 3 sets - 10 reps - Long Sitting Ankle Inversion with Resistance  - 2 x daily - 7 x weekly - 3 sets - 10 reps - Long Sitting Ankle Dorsiflexion with Anchored Resistance (Mirrored)  - 1-2 x daily - 7 x weekly - 2-3 sets - 10-15 reps - [place]  - 1-2 x daily - 7 x weekly - 1 sets - 1-2 mins hold - Tandem Stance in Corner  - 1-2 x daily - 7 x weekly - 1 sets - 3-5 reps - 30 hold - Heel Raises with Counter Support  - 1 x daily - 7 x weekly - 2-3 sets - 10 reps - Standing Gastroc Stretch on Step  - 2-3 x daily - 7 x weekly - 1 sets - 5 reps - 30 sec hold  ASSESSMENT:  CLINICAL IMPRESSION: The patient has attended 10 visits over the course of treatment cycle.  Patient has reported overall improvement at 60%. See objective data above for updated information regarding current presentation.  Gains have been noted in all areas but continued instances of instability on Lt ankle, particularly in stance on stairs.  Continued deficits noted in eversion strength control.  Continued skilled PT services medically necessary to make progressions towards safe return to hiking and other WB tasks such as work.    OBJECTIVE IMPAIRMENTS: Abnormal gait, decreased activity tolerance, decreased balance, decreased coordination, decreased endurance, decreased mobility, difficulty walking, decreased ROM, decreased strength, increased fascial restrictions, impaired perceived functional ability, impaired flexibility, improper body mechanics, and pain.   ACTIVITY LIMITATIONS: lifting, bending, sitting, standing, squatting, sleeping, stairs, transfers, bed mobility, and  locomotion level  PARTICIPATION LIMITATIONS: meal prep, cleaning, laundry, interpersonal relationship, driving, shopping, community activity, and occupation  PERSONAL FACTORS: No specific factors are also affecting patient's functional outcome.   REHAB POTENTIAL: Good  CLINICAL DECISION MAKING: Stable/uncomplicated  EVALUATION COMPLEXITY: Low   GOALS: Goals reviewed with patient? Yes  SHORT TERM GOALS: (target date for Short term goals are 3 weeks 05/22/2024)   1.  Patient will demonstrate independent use of home exercise program to maintain progress from in clinic treatments.  Goal status: Met in review 05/23/2024  LONG TERM GOALS: (target dates for all long term goals are 8 weeks  08/30/2024 )   1. Patient will demonstrate/report pain at worst less than or equal to 2/10 to facilitate minimal limitation in daily activity secondary to pain symptoms.  Goal status: on going 07/05/2024   2. Patient will demonstrate independent use of home exercise program to facilitate ability to maintain/progress functional gains from skilled physical therapy services.  Goal status: on going 07/05/2024   3.  Patient will demonstrate Patient specific functional scale avg > or = 8/10 to indicate reduced disability due to condition.   Goal status:Met 07/05/2024   4.  Patient will demonstrate Lt  LE MMT 5/5 throughout to faciltiate usual transfers, stairs, squatting at [facility] for daily life.   Goal status: on going 07/05/2024   5.  Patient will demonstrate Lt ankle AROM equal to Rt to facilitate mobility for ambulation, daily activity at PLOF.  Goal status: on going 07/05/2024   6. Patient will demonstrate ascending/descending stairs reciprocally s UE assist for community integration.   Goal status: Met 07/05/2024 but does have instances of instability   7.  Patient will report blase return to work.  Goal Status: on going 07/05/2024  8. Patient will demonstrate independent ambulation s deviation  for normal recreational walking, work walking.  Goal status: on going 07/05/2024   PLAN:  PT FREQUENCY: 1x/week  PT DURATION: 8 weeks  PLANNED INTERVENTIONS: Can include 02853- PT Re-evaluation, 97110-Therapeutic exercises, 97530- Therapeutic activity, 97112- Neuromuscular re-education, 97535- Self Care, 97140- Manual therapy, [phone]- Gait training, [phone]- Orthotic Fit/training, [phone]- Canalith repositioning, [id number]- Aquatic Therapy, [phone]- Electrical stimulation (unattended), [id number] Physical performance testing, 97016- Vasopneumatic device, [id number]- Ultrasound, [id number]- Traction (mechanical), [id number]- Ionotophoresis 4mg /ml Dexamethasone,  20560 - Needle insertion w/o injection 1 or 2 muscles, 20561 - Needle insertion w/o injection 3 or more muscles.   Patient/Family education, Balance training, Stair training, Taping, Dry Needling, Joint mobilization, Joint manipulation, Spinal manipulation, Spinal mobilization, Scar mobilization, Vestibular training, Visual/preceptual remediation/compensation, DME instructions, Cryotherapy, and Moist heat.  All performed as medically necessary.  All included unless contraindicated  PLAN FOR NEXT SESSION: Eversion strength gains, compliant surface and lateral movement control.   [employer] auth ends 07/10/2024  Ozell Silvan, PT, DPT, OCS, ATC 07/12/24  10:54 AM      [employer] Auth needed.     "

## 2024-07-19 ENCOUNTER — Telehealth: Payer: Self-pay | Admitting: Rehabilitative and Restorative Service Providers"

## 2024-07-19 ENCOUNTER — Encounter: Admitting: Rehabilitative and Restorative Service Providers"

## 2024-07-19 NOTE — Telephone Encounter (Signed)
 Called patient after appointment time passed.  Pt indicated she previously had called and mentioned that the insurance change increased her visit cost which she could not do at this time.  Cancelled future visits at this time with holding PT until Pt called back.  Discussed referral necessary past 30 days if chart was discharged.    Pt also mentioned lawyers wanted some medical status information.  Discussed contacting MD office.    Ozell Silvan, PT, DPT, OCS, ATC 07/19/24  10:44 AM

## 2024-07-19 NOTE — Therapy (Incomplete)
 " OUTPATIENT PHYSICAL THERAPY TREATMENT   Patient Name: Kari Pineda MRN: [id number] DOB:February 14, 1961, 64 y.o., female Today's Date: 07/19/2024   END OF SESSION:      No past medical history on file. Past Surgical History:  Procedure Laterality Date   APPENDECTOMY     CHOLECYSTECTOMY     TONSILLECTOMY     WRIST SURGERY     There are no active problems to display for this patient.   PCP: Burney Pao MD  REFERRING PROVIDER: Gerome Maurilio HERO, PA-C  REFERRING DIAG: [phone] (ICD-10-CM) - Impingement of anterior part of ankle M77.9 (ICD-10-CM) - Tendonitis  THERAPY DIAG:  No diagnosis found.  Rationale for Evaluation and Treatment: Rehabilitation  ONSET DATE:  02/11/2024  SUBJECTIVE:   SUBJECTIVE STATEMENT: Pt indicated driving to [place]  with short stay.  Reported stiffness, achy at times.   Pt indicated working on the exercise.   Pt indicated the giving way feeling twice going up stairs.  60% improvement noted at this time.   PERTINENT HISTORY:   PAIN:  NPRS scale:  up to 8/10 with quick movement , usually not painful outside of those.  Pain location: Lt ankle anteriorly.  Can shoot up leg.  Pain description: sharp, shooting.  Aggravating factors: prolonged WB activity, walking, standing.  Relieving factors: rest, inversion.   PRECAUTIONS: None  WEIGHT BEARING RESTRICTIONS: No  FALLS:  Has patient fallen in last 6 months? No  LIVING ENVIRONMENT: Lives in: House/apartment Stairs: flight of stairs to bedroom.   No stairs in garage.  Front porch with 3 stairs with bilateral rails.    OCCUPATION: [employer] (unable to work since injury.   PLOF: Independent, hiking, play softball/basketball.   PATIENT GOALS: Reduce pain, get back to activities.  Walk normal.  OBJECTIVE:   DIAGNOSTIC FINDINGS:  X rays 03/06/2024 B knee with medial joint line narrowing Ankle no fracture noted, mortis intact  PATIENT SURVEYS:  Patient-Specific Activity Scoring  Scheme  0 represents unable to perform. 10 represents able to perform at prior level. 0 1 2 3 4 5 6 7 8 9  10 (Date and Score)   Activity Eval  05/01/2024  05/30/2024 07/05/2024  1. Hike 3   6 5   2. Walk outside  3  10 10   3. Driving 4 7 9   4. Clean house 4 10 10   5. work 2 6 7   Score 3.2 7.8 8.2   Total score = sum of the activity scores/number of activities Minimum detectable change (90%CI) for average score = 2 points Minimum detectable change (90%CI) for single activity score = 3 points  COGNITION: 05/01/2024 Overall cognitive status: WFL    SENSATION: 05/01/2024 Not specific tested for dermatomes.   EDEMA:  05/01/2024 Figure 8 Lt ankle: 49 cm Figure 8 Rt ankle: 49 cms  MUSCLE LENGTH: 05/01/2024 No specific testing today.   POSTURE:  05/01/2024 Deviation off Lt leg in standing posture.   PALPATION: 05/01/2024 Tenderness anterior joint line Lt talocrural joint, lateral joint line inferior to lateral malleolus.   LOWER EXTREMITY MMT:    Right Eval 05/01/2024 Left Eval 05/01/2024 Left 05/30/2024 Left 06/16/2024 Left 06/28/2024 Left 06/28/2024  Hip flexion        Hip extension        Hip abduction        Hip adduction        Hip internal rotation        Hip external rotation        Knee flexion 5/5 4/5  5/5     Knee extension 5/5 4/5 5/5     Ankle dorsiflexion 5/5 3+/5 4+/5   5/5  Ankle plantarflexion  2+/5  3+/5 (2-3 reps in SL heel raise) 4/5 (able to reach 10 lifts)    Ankle inversion 5/5 3+/5 5/5   5/5  Ankle eversion 5/5 3+/5 4/5  4/5 4/5   (Blank rows = not tested)  LOWER EXTREMITY ROM:   Right Eval 05/01/2024 Left Eval 05/01/2024 Left 05/23/2024 AROM Left 06/05/2024 Left 06/23/2024 Left 07/05/2024  Hip flexion        Hip extension        Hip abduction        Hip adduction        Hip internal rotation        Hip external rotation        Knee flexion        Knee extension        Ankle dorsiflexion 13 AROM in 90 deg knee flexion  -9 AROM in 90 deg in knee flexion With pain  -11 AROM in 0 deg knee extension    0 deg in 0 deg knee extension long sitting 6 deg in 90 deg knee flexion   6 deg in 0 deg knee flexion  10 deg in 90 deg knee flexion    8 deg in 0 deg knee flexion  10 deg in 0 deg knee flexion   Ankle plantarflexion        Ankle inversion 32 22 c pain 35 40  44  Ankle eversion 32 12 c pain 30 40  48   (Blank rows = not tested)  LOWER EXTREMITY SPECIAL TESTS:  05/01/2024 No specific testing performed.   FUNCTIONAL TESTS:  06/20/2024: Lt leg SLS: 3-5 seconds on attempt   05/01/2024 18 inch chair transfer: able on 1st try with weight deviation to Rt due to pain.  Lt SLS no shoes:  unable unassisted, pain noted.  Rt SLS  no shoes: 30 seconds   GAIT: 05/01/2024 Independent ambulation c deviation in stance on Lt leg, lacking toe off progression due to antalgic gait, DF restriction.                                                                                                                                                                          TODAY'S TREATMENT  DATE: 07/19/2024 Therex:     TODAY'S TREATMENT                                                                          DATE: 07/05/2024 Therex: Nustep Lvl 6 8 mins UE/LE for ROM Blue band eccentric eversion control 3 x 10 Lt leg in long sitting.   Neuro Re-ed (muscle activation, coordination) Tandem stance on foam without shoes with slow head turns Lt and Rt during 1 min x 1 bilaterally  BAPS board sitting with 1 lb weight on lateral side of foot:  fwd/back x 20, side to side x 20, cw circles x 15, ccw circles x 15  Lateral stepping on foam over 9 inch hurdles in // bars with occasional HHA x 5 each way  Step over high knee march on foam 9 inch hurdle reciprocally in // bars with occasional HHA  3 hurdles x 12   TODAY'S TREATMENT                                                                           DATE: 06/28/2024 Therex: Nustep lvl 6 UE/LE for ROM 6 mins Blue band Lt ankle eversion 2 x 15, eccentric only x 10  Single leg PF Lt x 10  Incline gastroc stretch 30 sec x 3  Seated SLR Lt 2 x 10  TherActivity Step on over and down WB on Lt leg 6 inch step x 6 (popping noted) changed to 4 inch step x 10    Neuro Re-ed (muscle activation, coordination) Lt ankle eversion in long sitting isometric hold 10 sec x 5, reactive to clinician resistance through range x 10  SLS with slight knee bend 30 sec x 3 bilaterally with occasional HHA to correct LOB.  Cross over carioca stepping 15 ft x 2 each way (medial ankle symptoms noted on Lt leg when going Rt)  Lateral stepping 3 cones x 6 each, performed facing each way.    TODAY'S TREATMENT                                                                          DATE: 06/23/2024 Therex: Recumbent bike seat 4 for ROM.  Stopped at 5 mins due to knee tightness/pulling complaints Seated Lt hamstring stretch 30 sec x 3 to tolerance.  (Helped improve symptoms)  Seated legs outstretched green band around midfoot eversion x 15 with slow movement focus Lateral stepping c green band around midfoot with eversion focus 15 ft x 3 each way  Incline stretch 30 sec alternating with double leg PF with slow lowering focus    Neuro Re-ed (muscle activation, coordination) SLS on foam with occasionally HHA  on bar 30 sec x 3 alternating bilaterally  Fitter rocker board fwd/back light touching focus bilateral LE x 25 each way Forward step up on 6 inch step with airex foam on top with single leg balance hold 1-2 seconds with slow reverse step down - x 10 bilaterally with occasional to moderate HHA   PATIENT EDUCATION:  06/16/2024  Education details: HEP update Person educated: Patient Education method: Programmer, Multimedia, Demonstration, Verbal cues, and Handouts Education comprehension: verbalized understanding, returned  demonstration, and verbal cues required  HOME EXERCISE PROGRAM: Access Code: [email or web address] URL: [email or web address] Date: 06/16/2024 Prepared by: Ozell Silvan  Exercises - Long Sitting Calf Stretch with Strap  - 2-3 x daily - 7 x weekly - 1 sets - 3-5 reps - 30 hold - Long Sitting Ankle Eversion with Resistance  - 2 x daily - 7 x weekly - 3 sets - 10 reps - Long Sitting Ankle Inversion with Resistance  - 2 x daily - 7 x weekly - 3 sets - 10 reps - Long Sitting Ankle Dorsiflexion with Anchored Resistance (Mirrored)  - 1-2 x daily - 7 x weekly - 2-3 sets - 10-15 reps - [place]  - 1-2 x daily - 7 x weekly - 1 sets - 1-2 mins hold - Tandem Stance in Corner  - 1-2 x daily - 7 x weekly - 1 sets - 3-5 reps - 30 hold - Heel Raises with Counter Support  - 1 x daily - 7 x weekly - 2-3 sets - 10 reps - Standing Gastroc Stretch on Step  - 2-3 x daily - 7 x weekly - 1 sets - 5 reps - 30 sec hold  ASSESSMENT:  CLINICAL IMPRESSION: The patient has attended 10 visits over the course of treatment cycle.  Patient has reported overall improvement at 60%. See objective data above for updated information regarding current presentation.  Gains have been noted in all areas but continued instances of instability on Lt ankle, particularly in stance on stairs.  Continued deficits noted in eversion strength control.  Continued skilled PT services medically necessary to make progressions towards safe return to hiking and other WB tasks such as work.    OBJECTIVE IMPAIRMENTS: Abnormal gait, decreased activity tolerance, decreased balance, decreased coordination, decreased endurance, decreased mobility, difficulty walking, decreased ROM, decreased strength, increased fascial restrictions, impaired perceived functional ability, impaired flexibility, improper body mechanics, and pain.   ACTIVITY LIMITATIONS: lifting, bending, sitting, standing, squatting, sleeping, stairs, transfers, bed mobility, and  locomotion level  PARTICIPATION LIMITATIONS: meal prep, cleaning, laundry, interpersonal relationship, driving, shopping, community activity, and occupation  PERSONAL FACTORS: No specific factors are also affecting patient's functional outcome.   REHAB POTENTIAL: Good  CLINICAL DECISION MAKING: Stable/uncomplicated  EVALUATION COMPLEXITY: Low   GOALS: Goals reviewed with patient? Yes  SHORT TERM GOALS: (target date for Short term goals are 3 weeks 05/22/2024)   1.  Patient will demonstrate independent use of home exercise program to maintain progress from in clinic treatments.  Goal status: Met in review 05/23/2024  LONG TERM GOALS: (target dates for all long term goals are 8 weeks  08/30/2024 )   1. Patient will demonstrate/report pain at worst less than or equal to 2/10 to facilitate minimal limitation in daily activity secondary to pain symptoms.  Goal status: on going 07/05/2024   2. Patient will demonstrate independent use of home exercise program to facilitate ability to maintain/progress functional gains from skilled physical therapy services.  Goal status: on going 07/05/2024   3.  Patient will demonstrate Patient specific functional scale avg > or = 8/10 to indicate reduced disability due to condition.   Goal status:Met 07/05/2024   4.  Patient will demonstrate Lt  LE MMT 5/5 throughout to faciltiate usual transfers, stairs, squatting at [facility] for daily life.   Goal status: on going 07/05/2024   5.  Patient will demonstrate Lt ankle AROM equal to Rt to facilitate mobility for ambulation, daily activity at PLOF.  Goal status: on going 07/05/2024   6. Patient will demonstrate ascending/descending stairs reciprocally s UE assist for community integration.   Goal status: Met 07/05/2024 but does have instances of instability   7.  Patient will report blase return to work.  Goal Status: on going 07/05/2024  8. Patient will demonstrate independent ambulation s deviation  for normal recreational walking, work walking.  Goal status: on going 07/05/2024   PLAN:  PT FREQUENCY: 1x/week  PT DURATION: 8 weeks  PLANNED INTERVENTIONS: Can include 02853- PT Re-evaluation, 97110-Therapeutic exercises, 97530- Therapeutic activity, 97112- Neuromuscular re-education, 97535- Self Care, 97140- Manual therapy, [phone]- Gait training, [phone]- Orthotic Fit/training, [phone]- Canalith repositioning, [id number]- Aquatic Therapy, [phone]- Electrical stimulation (unattended), [id number] Physical performance testing, 97016- Vasopneumatic device, [id number]- Ultrasound, [id number]- Traction (mechanical), [id number]- Ionotophoresis 4mg /ml Dexamethasone,  20560 - Needle insertion w/o injection 1 or 2 muscles, 20561 - Needle insertion w/o injection 3 or more muscles.   Patient/Family education, Balance training, Stair training, Taping, Dry Needling, Joint mobilization, Joint manipulation, Spinal manipulation, Spinal mobilization, Scar mobilization, Vestibular training, Visual/preceptual remediation/compensation, DME instructions, Cryotherapy, and Moist heat.  All performed as medically necessary.  All included unless contraindicated  PLAN FOR NEXT SESSION: Eversion strength gains, compliant surface and lateral movement control.   [employer] auth ends 07/10/2024  Ozell Silvan, PT, DPT, OCS, ATC 07/19/24  8:38 AM      [employer] [place] needed.     "

## 2024-07-26 ENCOUNTER — Encounter: Admitting: Rehabilitative and Restorative Service Providers"

## 2024-08-02 ENCOUNTER — Encounter: Admitting: Rehabilitative and Restorative Service Providers"
